# Patient Record
Sex: Male | Born: 1970 | Race: White | Hispanic: No | Marital: Married | Smoking: Never smoker
Health system: Southern US, Community
[De-identification: ages and names within clinical notes are randomized; demographics above are authoritative.]

## PROBLEM LIST (undated history)

## (undated) DIAGNOSIS — K429 Umbilical hernia without obstruction or gangrene: Secondary | ICD-10-CM

## (undated) DIAGNOSIS — D6851 Activated protein C resistance: Secondary | ICD-10-CM

## (undated) HISTORY — DX: Umbilical hernia without obstruction or gangrene: K42.9

---

## 2011-07-10 ENCOUNTER — Other Ambulatory Visit (HOSPITAL_BASED_OUTPATIENT_CLINIC_OR_DEPARTMENT_OTHER): Payer: Self-pay | Admitting: Family Medicine

## 2011-07-12 ENCOUNTER — Ambulatory Visit (HOSPITAL_BASED_OUTPATIENT_CLINIC_OR_DEPARTMENT_OTHER)
Admission: RE | Admit: 2011-07-12 | Discharge: 2011-07-12 | Disposition: A | Discharge status: Home / Self Care | Payer: 59 | Source: Ambulatory Visit | Attending: Family Medicine | Admitting: Family Medicine

## 2011-07-12 DIAGNOSIS — K7689 Other specified diseases of liver: Secondary | ICD-10-CM | POA: Insufficient documentation

## 2011-07-12 DIAGNOSIS — R109 Unspecified abdominal pain: Secondary | ICD-10-CM | POA: Insufficient documentation

## 2011-07-12 DIAGNOSIS — R1031 Right lower quadrant pain: Secondary | ICD-10-CM | POA: Insufficient documentation

## 2011-07-12 MED ORDER — IOHEXOL 300 MG/ML  SOLN: 100.0000 mL | Freq: Once | INTRAMUSCULAR | Status: AC | PRN

## 2012-01-08 ENCOUNTER — Other Ambulatory Visit (HOSPITAL_BASED_OUTPATIENT_CLINIC_OR_DEPARTMENT_OTHER): Payer: Self-pay | Admitting: Family Medicine

## 2012-01-08 DIAGNOSIS — D1800 Hemangioma unspecified site: Secondary | ICD-10-CM

## 2012-01-12 ENCOUNTER — Ambulatory Visit (HOSPITAL_BASED_OUTPATIENT_CLINIC_OR_DEPARTMENT_OTHER)
Admission: RE | Admit: 2012-01-12 | Discharge: 2012-01-12 | Disposition: A | Discharge status: Home / Self Care | Payer: 59 | Source: Ambulatory Visit | Attending: Family Medicine | Admitting: Family Medicine

## 2012-01-12 DIAGNOSIS — D1800 Hemangioma unspecified site: Secondary | ICD-10-CM

## 2012-01-12 DIAGNOSIS — D1803 Hemangioma of intra-abdominal structures: Secondary | ICD-10-CM | POA: Insufficient documentation

## 2012-01-12 MED ORDER — GADOBENATE DIMEGLUMINE 529 MG/ML IV SOLN
20.0000 mL | Freq: Once | INTRAVENOUS | Status: AC | PRN
  Administered 2012-01-12: 20 mL via INTRAVENOUS

## 2013-03-01 ENCOUNTER — Emergency Department (HOSPITAL_BASED_OUTPATIENT_CLINIC_OR_DEPARTMENT_OTHER): Payer: 59

## 2013-03-01 ENCOUNTER — Encounter (HOSPITAL_BASED_OUTPATIENT_CLINIC_OR_DEPARTMENT_OTHER): Payer: Self-pay | Admitting: *Deleted

## 2013-03-01 ENCOUNTER — Emergency Department (HOSPITAL_BASED_OUTPATIENT_CLINIC_OR_DEPARTMENT_OTHER)
Admission: EM | Admit: 2013-03-01 | Discharge: 2013-03-01 | Disposition: A | Discharge status: Home / Self Care | Payer: 59 | Attending: Emergency Medicine | Admitting: Emergency Medicine

## 2013-03-01 DIAGNOSIS — S300XXA Contusion of lower back and pelvis, initial encounter: Secondary | ICD-10-CM

## 2013-03-01 DIAGNOSIS — S20229A Contusion of unspecified back wall of thorax, initial encounter: Secondary | ICD-10-CM | POA: Insufficient documentation

## 2013-03-01 DIAGNOSIS — R42 Dizziness and giddiness: Secondary | ICD-10-CM | POA: Insufficient documentation

## 2013-03-01 DIAGNOSIS — R110 Nausea: Secondary | ICD-10-CM | POA: Insufficient documentation

## 2013-03-01 DIAGNOSIS — Y9239 Other specified sports and athletic area as the place of occurrence of the external cause: Secondary | ICD-10-CM | POA: Insufficient documentation

## 2013-03-01 DIAGNOSIS — Y9389 Activity, other specified: Secondary | ICD-10-CM | POA: Insufficient documentation

## 2013-03-01 DIAGNOSIS — R509 Fever, unspecified: Secondary | ICD-10-CM | POA: Insufficient documentation

## 2013-03-01 DIAGNOSIS — Z862 Personal history of diseases of the blood and blood-forming organs and certain disorders involving the immune mechanism: Secondary | ICD-10-CM | POA: Insufficient documentation

## 2013-03-01 HISTORY — DX: Activated protein C resistance: D68.51

## 2013-03-01 LAB — CBC
HCT: 41.5 % (ref 39.0–52.0)
MCH: 32.8 pg (ref 26.0–34.0)
MCHC: 35.4 g/dL (ref 30.0–36.0)
MCV: 92.6 fL (ref 78.0–100.0)
RDW: 12.6 % (ref 11.5–15.5)

## 2013-03-01 LAB — BASIC METABOLIC PANEL
BUN: 19 mg/dL (ref 6–23)
Creatinine, Ser: 1.1 mg/dL (ref 0.50–1.35)
GFR calc Af Amer: >90 mL/min (ref >90)
GFR calc non Af Amer: 81 mL/min — ABNORMAL LOW (ref >90)

## 2013-03-01 MED ORDER — SODIUM CHLORIDE 0.9 % IV SOLN
INTRAVENOUS | Status: DC
  Administered 2013-03-01: 20:00:00 via INTRAVENOUS

## 2013-03-01 MED ORDER — IOHEXOL 300 MG/ML  SOLN
100.0000 mL | Freq: Once | INTRAMUSCULAR | Status: AC | PRN
  Administered 2013-03-01: 100 mL via INTRAVENOUS

## 2013-03-01 NOTE — ED Notes (Signed)
Pt reports he was thrown from horse this afternoon and landed on sand- was wearing helmet- landed on back then hit head- states similar 1 week ago- reports he went to dinner this evening and while driving home had an episode of nausea, diaphoresis and "lost my vision". Sx have resolved at present. Denies pain.

## 2013-03-01 NOTE — ED Notes (Signed)
Pt was thrown from a horse earlier today. Having some low back pain, but was doing ok until he ate supper. Began feeling light-headed and lost vision temporarily. No neuro s/s at present. PERRL.

## 2013-03-01 NOTE — ED Provider Notes (Signed)
History  This chart was scribed for Celene Kras, MD by Greggory Stallion, ED Scribe. This patient was seen in room MH05/MH05 and the patient's care was started at 6:47 PM.  CSN: [id number]  Arrival date & time 03/01/13  1836    Chief Complaint  Patient presents with  . Fall    The history is provided by the patient. No language interpreter was used.    HPI Comments: Shane [name] is a 42 y.o. male who presents to the Emergency Department complaining of sudden, constant lower back pain that started earlier today when he was thrown from a horse. Pt states he hit his head but he had a helmet on. He states he had a fever and nausea as associated symptoms. Pt states he was doing okay until he ate dinner and began to feel light-headed and lost vision temporarily. Pt denies neck pain, sore throat, CP, cough, SOB, abdominal pain, diarrhea, urinary symptoms, HA, weakness, numbness and rash as associated symptoms. Pt states he took Motrin for pain with some relief and states he does not need any more currently.  Past Medical History  Diagnosis Date  . Factor 5 Leiden mutation, heterozygous     History reviewed. No pertinent past surgical history.  History reviewed. No pertinent family history.  History  Substance Use Topics  . Smoking status: Never Smoker   . Smokeless tobacco: Not on file  . Alcohol Use: Yes      Review of Systems  A complete 10 system review of systems was obtained and all systems are negative except as noted in the HPI and PMH.   Allergies  Zithromax  Home Medications   Current Outpatient Rx  Name  Route  Sig  Dispense  Refill  . cholestyramine (QUESTRAN) 4 GM/DOSE powder   Oral   Take 4 g by mouth 3 (three) times daily with meals.           BP 102/59  Pulse 78  Temp(Src) 98.2 F (36.8 C) (Oral)  Resp 20  Ht 6\' 1"  (1.854 m)  Wt 195 lb (88.451 kg)  BMI 25.73 kg/m2  SpO2 95%  Physical Exam  Nursing note and vitals  reviewed. Constitutional: He appears well-developed and well-nourished. No distress.  HENT:  Head: Normocephalic and atraumatic. Head is without raccoon's eyes and without Battle's sign.  Right Ear: External ear normal.  Left Ear: External ear normal.  Eyes: Lids are normal. Right eye exhibits no discharge. Right conjunctiva has no hemorrhage. Left conjunctiva has no hemorrhage.  Neck: No spinous process tenderness present. No tracheal deviation and no edema present.  Cardiovascular: Normal rate, regular rhythm and normal heart sounds.   Pulmonary/Chest: Effort normal and breath sounds normal. No stridor. No respiratory distress. He exhibits no tenderness, no crepitus and no deformity.  Abdominal: Soft. Normal appearance and bowel sounds are normal. He exhibits no distension and no mass. There is no tenderness.  Negative for seat belt sign  Musculoskeletal:       Cervical back: He exhibits no tenderness, no swelling and no deformity.       Thoracic back: He exhibits no tenderness, no swelling and no deformity.       Lumbar back: He exhibits tenderness, swelling and pain.  Pelvis stable, no ttp, contusion noted on the left hip area as well as the lower lumbosacral  Neurological: He is alert. He has normal strength. No sensory deficit. He exhibits normal muscle tone. GCS eye subscore is 4. GCS verbal  subscore is 5. GCS motor subscore is 6.  Able to move all extremities, sensation intact throughout  Skin: He is not diaphoretic.  Psychiatric: He has a normal mood and affect. His speech is normal and behavior is normal.    ED Course  Procedures (including critical care time) EKG Normal sinus rhythm rate 91 Normal axis, normal intervals Normal ST-T wave No prior EKG for comparison DIAGNOSTIC STUDIES: Oxygen Saturation is 95% on RA, adequate by my interpretation.    COORDINATION OF CARE: 7:00 PM-Discussed treatment plan with pt at bedside and pt agreed to plan.   Labs Reviewed  CBC -  Abnormal; Notable for the following:    WBC 16.7 (*)    All other components within normal limits  BASIC METABOLIC PANEL - Abnormal; Notable for the following:    GFR calc non Af Amer 81 (*)    All other components within normal limits   Ct Head Wo Contrast  03/01/2013   *RADIOLOGY REPORT*  Clinical Data: Thrown from a horse, striking the head.  The patient did have on a helmet.  Current history of Factor V Leiden mutation.  CT HEAD WITHOUT CONTRAST  Technique:  Contiguous axial images were obtained from the base of the skull through the vertex without contrast.  Comparison: None.  Findings: Ventricular system normal in size and appearance for age. No mass lesion.  No midline shift.  No acute hemorrhage or hematoma.  No extra-axial fluid collections.  No evidence of acute infarction.  No focal brain parenchymal abnormalities.  No skull fractures or other focal osseous abnormalities involving the skull.  Visualized paranasal sinuses, mastoid air cells, and middle ear cavities well-aerated.  IMPRESSION: Normal examination.   Original Report Authenticated By: Hulan Saas, M.D.   Ct Abdomen Pelvis W Contrast  03/01/2013   *RADIOLOGY REPORT*  Clinical Data: Thrown from a horse earlier today.  Generalized back pain.  Current history of Factor V Leiden mutation.  CT ABDOMEN AND PELVIS WITH CONTRAST  Technique:  Multidetector CT imaging of the abdomen and pelvis was performed following the standard protocol during bolus administration of intravenous contrast.  Contrast: OMNIPAQUE IOHEXOL 300 MG/ML IV.  Comparison: CT abdomen and pelvis 07/12/2011.  MRI abdomen 01/12/2012.  Findings: Edema/ecchymosis involving the subcutaneous tissues of the right flank and right side of pelvis, extending to the posterior midline.  Hematoma in the posterior subcutaneous tissues underlying the upper pelvis measuring approximately 12.4 x 3.7 x 12.2 cm (axial image 62 andsagittal image 57).  No evidence of intraperitoneal  hemorrhage or retroperitoneal hematoma in the abdomen or pelvis.  No acute traumatic injury to the abdominal or pelvic viscera. Approximate 3.5 cm right hepatic hemangioma near the dome as noted on prior examinations.  No significant focal hepatic parenchymal abnormality.  Normal appearing spleen, pancreas, adrenal glands, and kidneys.  Gallbladder contracted but otherwise unremarkable (large amount of food in the stomach accounts for this).  Normal vascular structures.  No significant lymphadenopathy.  Normal-appearing stomach filled with food.  Normal-appearing small bowel.  Opaque ingested material within the ascending colon; entire colon normal in appearance.  Normal appendix in the right low pelvis.  No ascites.  Urinary bladder unremarkable.  Prostate gland and seminal vesicles normal for age.  Bone window images demonstrate no fractures involving the lower thoracic or lumbar spine, lower ribs, or bony pelvis.  Disc space narrowing is present at L4-5.  Visualized lung bases clear.  Heart size normal.  IMPRESSION:  1.  No evidence of  acute traumatic injury to the abdominal or pelvic viscera. 2.  Edema/ecchymosis in the subcutaneous tissues of the right flank extending to the posterior midline in the upper pelvis.  There is an associated moderately large subcutaneous hematoma. 3.  No evidence of intraperitoneal hemorrhage or retroperitoneal hematoma in the abdomen or pelvis. 4.  No fractures identified in the visualized skeleton.  5.  Approximate 3.5 cm right lobe hepatic hemangioma near the dome as noted previously.   Original Report Authenticated By: Hulan Saas, M.D.     1. Lumbar contusion, initial encounter   2. Fall from horse, initial encounter       MDM  The patient does not have any evidence of serious injury associated with his fall. I suspect he may have had a vasovagal episode earlier.  He has been asymptomatic and stable in the ED.  At this time there does not appear to be any  evidence of an acute emergency medical condition and the patient appears stable for discharge with appropriate outpatient follow up.    I personally performed the services described in this documentation, which was scribed in my presence. The recorded information has been reviewed and is accurate.   Celene Kras, MD 03/01/13 [phone]

## 2013-03-01 NOTE — ED Notes (Signed)
Patient transported to CT 

## 2013-03-01 NOTE — ED Notes (Signed)
MD at bedside. Dr. Lynelle Doctor in to talk with pt.

## 2015-04-30 ENCOUNTER — Emergency Department (HOSPITAL_BASED_OUTPATIENT_CLINIC_OR_DEPARTMENT_OTHER)
Admission: EM | Admit: 2015-04-30 | Discharge: 2015-04-30 | Disposition: A | Discharge status: Home / Self Care | Payer: 59 | Attending: Emergency Medicine | Admitting: Emergency Medicine

## 2015-04-30 ENCOUNTER — Encounter (HOSPITAL_BASED_OUTPATIENT_CLINIC_OR_DEPARTMENT_OTHER): Payer: Self-pay | Admitting: *Deleted

## 2015-04-30 DIAGNOSIS — Z79899 Other long term (current) drug therapy: Secondary | ICD-10-CM | POA: Diagnosis not present

## 2015-04-30 DIAGNOSIS — Y9289 Other specified places as the place of occurrence of the external cause: Secondary | ICD-10-CM | POA: Insufficient documentation

## 2015-04-30 DIAGNOSIS — S0101XA Laceration without foreign body of scalp, initial encounter: Secondary | ICD-10-CM | POA: Diagnosis not present

## 2015-04-30 DIAGNOSIS — S0990XA Unspecified injury of head, initial encounter: Secondary | ICD-10-CM | POA: Diagnosis present

## 2015-04-30 DIAGNOSIS — Y998 Other external cause status: Secondary | ICD-10-CM | POA: Diagnosis not present

## 2015-04-30 DIAGNOSIS — W01198A Fall on same level from slipping, tripping and stumbling with subsequent striking against other object, initial encounter: Secondary | ICD-10-CM | POA: Insufficient documentation

## 2015-04-30 DIAGNOSIS — Z862 Personal history of diseases of the blood and blood-forming organs and certain disorders involving the immune mechanism: Secondary | ICD-10-CM | POA: Insufficient documentation

## 2015-04-30 DIAGNOSIS — Z23 Encounter for immunization: Secondary | ICD-10-CM | POA: Diagnosis not present

## 2015-04-30 DIAGNOSIS — Y9389 Activity, other specified: Secondary | ICD-10-CM | POA: Diagnosis not present

## 2015-04-30 MED ORDER — LIDOCAINE-EPINEPHRINE-TETRACAINE (LET) SOLUTION
3.0000 mL | Freq: Once | NASAL | Status: AC
  Administered 2015-04-30: 3 mL via TOPICAL
  Filled 2015-04-30: qty 3

## 2015-04-30 MED ORDER — TETANUS-DIPHTH-ACELL PERTUSSIS 5-2.5-18.5 LF-MCG/0.5 IM SUSP
0.5000 mL | Freq: Once | INTRAMUSCULAR | Status: AC
  Administered 2015-04-30: 0.5 mL via INTRAMUSCULAR
  Filled 2015-04-30: qty 0.5

## 2015-04-30 MED ORDER — LIDOCAINE-EPINEPHRINE (PF) 2 %-1:200000 IJ SOLN
10.0000 mL | Freq: Once | INTRAMUSCULAR | Status: AC
  Administered 2015-04-30: 10 mL
  Filled 2015-04-30: qty 10

## 2015-04-30 NOTE — ED Notes (Signed)
approx 29mm laceration noted at forehead, hairline, no active bleeding noted at this time

## 2015-04-30 NOTE — ED Provider Notes (Signed)
CSN: [id number]     Arrival date & time 04/30/15  1134 History   First MD Initiated Contact with Patient 04/30/15 1211     Chief Complaint  Patient presents with  . Head Injury     (Consider location/radiation/quality/duration/timing/severity/associated sxs/prior Treatment) HPI   Blood pressure 145/90, pulse 80, temperature 97.9 F (36.6 C), temperature source Oral, resp. rate 18, height 6\' 1"  (1.854 m), weight 200 lb (90.719 kg), SpO2 99 %.  Shane [name] is a 44 y.o. male complaining of laceration to frontal scalp after patient fell off a however board approximately an hour and a half ago. Patient's head hit the side of the refrigerator, there was no loss of consciousness, anticoagulation, cervicalgia, change in vision, nausea, vomiting, numbness, weakness, chest pain, shortness of breath, abdominal pain, difficulty moving major joints. He rates his pain at 2 out of 10. His tetanus shot is unknown.  Past Medical History  Diagnosis Date  . Factor 5 Leiden mutation, heterozygous    History reviewed. No pertinent past surgical history. History reviewed. No pertinent family history. History  Substance Use Topics  . Smoking status: Never Smoker   . Smokeless tobacco: Not on file  . Alcohol Use: Yes    Review of Systems  10 systems reviewed and found to be negative, except as noted in the HPI.   Allergies  Zithromax  Home Medications   Prior to Admission medications   Medication Sig Start Date End Date Taking? Authorizing Provider  cholestyramine (QUESTRAN) 4 GM/DOSE powder Take 4 g by mouth 3 (three) times daily with meals.    Historical Provider, MD   BP 145/90 mmHg  Pulse 80  Temp(Src) 97.9 F (36.6 C) (Oral)  Resp 18  Ht 6\' 1"  (1.854 m)  Wt 200 lb (90.719 kg)  BMI 26.39 kg/m2  SpO2 99% Physical Exam  Constitutional: He is oriented to person, place, and time. He appears well-developed and well-nourished.  HENT:  Head: Normocephalic.    Mouth/Throat:  Oropharynx is clear and moist.  2 cm full  thickness laceration as diagrammed  No hemotympanum, battle signs or raccoon's eyes  No crepitance or tenderness to palpation along the orbital rim.  EOMI intact with no pain or diplopia  No abnormal otorrhea or rhinorrhea. Nasal septum midline.  No intraoral trauma.  Eyes: Conjunctivae and EOM are normal. Pupils are equal, round, and reactive to light.  Neck: Normal range of motion. Neck supple.  No midline C-spine  tenderness to palpation or step-offs appreciated. Patient has full range of motion without pain.  Grip/Biceps/Tricep strength 5/5 bilaterally, sensation to UE intact bilaterally.    Cardiovascular: Normal rate, regular rhythm and intact distal pulses.   Pulmonary/Chest: Effort normal and breath sounds normal. No respiratory distress. He has no wheezes. He has no rales. He exhibits no tenderness.  No seatbelt sign, TTP or crepitance  Abdominal: Soft. Bowel sounds are normal. He exhibits no distension and no mass. There is no tenderness. There is no rebound and no guarding.  No Seatbelt Sign  Musculoskeletal: Normal range of motion. He exhibits no edema or tenderness.  Pelvis stable. No deformity or TTP of major joints.   Good ROM  Neurological: He is alert and oriented to person, place, and time.  Strength 5/5 x4 extremities   Distal sensation intact  Skin: Skin is warm.  Psychiatric: He has a normal mood and affect.  Nursing note and vitals reviewed.   ED Course  LACERATION REPAIR Date/Time: 04/30/2015 1:30 PM Performed  by: [place], [place] by: Monico Blitz Consent: Verbal consent obtained. Patient identity confirmed: verbally with patient Body area: head/neck Laceration length: 2 cm Tendon involvement: none Anesthesia: local infiltration Local anesthetic: lidocaine 2% with epinephrine and LET (lido,epi,tetracaine) Anesthetic total: 5 ml Preparation: Patient was prepped and draped in the usual  sterile fashion. Irrigation solution: saline Irrigation method: syringe Amount of cleaning: standard Debridement: none Degree of undermining: none Wound skin closure material used: 5-0 Vicryl Rapide. Number of sutures: 6 Technique: simple Approximation: close Approximation difficulty: simple Dressing: antibiotic ointment Patient tolerance: Patient tolerated the procedure well with no immediate complications   (including critical care time) Labs Review Labs Reviewed - No data to display  Imaging Review No results found.   EKG Interpretation None      MDM   Final diagnoses:  None    Filed Vitals:   04/30/15 1138  BP: 145/90  Pulse: 80  Temp: 97.9 F (36.6 C)  TempSrc: Oral  Resp: 18  Height: 6\' 1"  (1.854 m)  Weight: 200 lb (90.719 kg)  SpO2: 99%    Medications  lidocaine-EPINEPHrine-tetracaine (LET) solution (not administered)  Tdap (BOOSTRIX) injection 0.5 mL (not administered)  lidocaine-EPINEPHrine (XYLOCAINE W/EPI) 2 %-1:200000 (PF) injection 10 mL (not administered)    Shane [name] is a pleasant 44 y.o. male presenting with partial thickness scalp laceration after patient fell while playing with a however board. No signs of significant head trauma, no neuro imaging is indicated. Tetanus will be updated and will be closed  Evaluation does not show pathology that would require ongoing emergent intervention or inpatient treatment. Pt is hemodynamically stable and mentating appropriately. Discussed findings and plan with patient/guardian, who agrees with care plan. All questions answered. Return precautions discussed and outpatient follow up given.       Monico Blitz, PA-C 04/30/15 [place], MD 04/30/15 1558

## 2015-04-30 NOTE — Discharge Instructions (Signed)
Keep wound dry and do not remove dressing for 24 hours if possible. After that, wash gently morning and night (every 12 hours) with soap and water. Use a topical antibiotic ointment and cover with a bandaid or gauze.    Do NOT use rubbing alcohol or hydrogen peroxide, do not soak the area   After 10-14 days you can try to gently remove the outer part of the suture material with a tweezers. Your suture material is dissolving but the outside will not resolve.   Every attempt was made to remove foreign body (contaminants) from the wound.  However, there is always a chance that some may remain in the wound. This can  increase your risk of infection.   If you see signs of infection (warmth, redness, tenderness, pus, sharp increase in pain, fever, red streaking in the skin) immediately return to the emergency department.   After the wound heals fully, apply sunscreen for 6-12 months to minimize scarring.

## 2015-04-30 NOTE — ED Notes (Signed)
Pt states fell off a hover board, forehead hit edge of refrigerator

## 2015-04-30 NOTE — ED Notes (Signed)
Pt c/o head injury with lac fall off "hoverboard" x 45 mins ago  Denies LOC

## 2018-05-29 ENCOUNTER — Ambulatory Visit: Payer: 59 | Admitting: Family Medicine

## 2018-05-29 ENCOUNTER — Encounter: Payer: Self-pay | Admitting: Family Medicine

## 2018-05-29 VITALS — BP 124/78 | HR 86 | Temp 98.1°F | Ht 73.0 in | Wt 202.0 lb

## 2018-05-29 DIAGNOSIS — M67911 Unspecified disorder of synovium and tendon, right shoulder: Secondary | ICD-10-CM

## 2018-05-29 DIAGNOSIS — R7989 Other specified abnormal findings of blood chemistry: Secondary | ICD-10-CM

## 2018-05-29 DIAGNOSIS — K529 Noninfective gastroenteritis and colitis, unspecified: Secondary | ICD-10-CM | POA: Insufficient documentation

## 2018-05-29 DIAGNOSIS — K429 Umbilical hernia without obstruction or gangrene: Secondary | ICD-10-CM | POA: Diagnosis not present

## 2018-05-29 DIAGNOSIS — J309 Allergic rhinitis, unspecified: Secondary | ICD-10-CM | POA: Insufficient documentation

## 2018-05-29 DIAGNOSIS — N529 Male erectile dysfunction, unspecified: Secondary | ICD-10-CM | POA: Diagnosis not present

## 2018-05-29 MED ORDER — DICLOFENAC SODIUM 75 MG PO TBEC: 75.0000 mg | DELAYED_RELEASE_TABLET | Freq: Two times a day (BID) | ORAL | 0 refills | Status: DC

## 2018-05-29 MED ORDER — SILDENAFIL CITRATE 20 MG PO TABS: 20.0000 mg | ORAL_TABLET | Freq: Every day | ORAL | 3 refills | Status: DC | PRN

## 2018-05-29 NOTE — Assessment & Plan Note (Signed)
May possibly have small tear as well.  We will proceed with conservative management given that symptoms have been persistent for the past several months years.  Start diclofenac 75 mg twice daily for the next 1 to 2 weeks.  Discussed home exercise program as well.  Recommended ice as needed.  No improvement, would consider referral to physical therapy and/or sports medicine.

## 2018-05-29 NOTE — Patient Instructions (Signed)
It was very nice to see you today!  Please start the sildenafil.  Please start the diclofenac and work on the exercises.  Let me know if your symptoms are worsening or not improving over the next few weeks.   Take care, Dr Jerline Pain

## 2018-05-29 NOTE — Assessment & Plan Note (Signed)
Stable on cholestyramine. 

## 2018-05-29 NOTE — Progress Notes (Signed)
Subjective:  Shane [name] is a 47 y.o. male who presents today with a chief complaint of shoulder pain and to establish care.   HPI:  Shoulder Pain, chronic problem, new to provider Several year history.  Located in the right shoulder.  Worse with movement above his head.  Symptoms have worsened recently.  He used to play baseball and thinks that this could have contributed.  No obvious injuries or precipitating events.  Tried heat and ice without significant improvement.  No other treatments tried.  No obvious alleviating or aggravating factors.  Umbilical hernia, chronic problem, new to provider Several year history.  Has been evaluated by  PCP.  He has not having any significant symptoms.  Allergic Rhinitis, chronic problem, new to provider Several year history.  Takes Xyzal as needed.  Low testosterone, chronic problem, new to provider Severe history.  Currently on testosterone injections 300 mg every 2 weeks.  Symptoms have with his energy levels and low libido, however sometimes still has difficulty with erections.  No premature ejaculation.  No reported dysuria or penile discharge.  ROS: Per HPI, otherwise a complete review of systems was negative.   PMH:  The following were reviewed and entered/updated in epic: Past Medical History:  Diagnosis Date  . Factor 5 Leiden mutation, heterozygous ([place])   . Umbilical hernia    Patient Active Problem List   Diagnosis Date Noted  . Tendinopathy of rotator cuff, right 05/29/2018  . Erectile dysfunction 05/29/2018  . Allergic rhinitis 05/29/2018  . Low testosterone 05/29/2018  . Chronic diarrhea 05/29/2018  . Umbilical hernia    History reviewed. No pertinent surgical history.  Family History  Problem Relation Age of Onset  . Cancer Neg Hx     Medications- reviewed and updated Current Outpatient Medications  Medication Sig Dispense Refill  . cholestyramine (QUESTRAN) 4 GM/DOSE powder Take 4 g by mouth Nightly.      . levocetirizine (XYZAL) 5 MG tablet Take 5 mg by mouth every evening.    . Multiple Vitamin (MULTIVITAMIN) tablet Take 1 tablet by mouth daily.    Marland Kitchen testosterone cypionate (DEPOTESTOTERONE CYPIONATE) 100 MG/ML injection Inject 300 mg into the muscle every 14 (fourteen) days. For IM use only    . diclofenac (VOLTAREN) 75 MG EC tablet Take 1 tablet (75 mg total) by mouth 2 (two) times daily. 30 tablet 0  . sildenafil (REVATIO) 20 MG tablet Take 1-5 tablets (20-100 mg total) by mouth daily as needed (erectile dysfunction). 90 tablet 3   No current facility-administered medications for this visit.     Allergies-reviewed and updated Allergies  Allergen Reactions  . Zithromax [Azithromycin] Hives    Social History   Socioeconomic History  . Marital status: Married    Spouse name: Not on file  . Number of children: Not on file  . Years of education: Not on file  . Highest education level: Not on file  Occupational History  . Not on file  Social Needs  . Financial resource strain: Not on file  . Food insecurity:    Worry: Not on file    Inability: Not on file  . Transportation needs:    Medical: Not on file    Non-medical: Not on file  Tobacco Use  . Smoking status: Never Smoker  . Smokeless tobacco: Never Used  Substance and Sexual Activity  . Alcohol use: Yes  . Drug use: No  . Sexual activity: Not on file  Lifestyle  . Physical activity:  Days per week: Not on file    Minutes per session: Not on file  . Stress: Not on file  Relationships  . Social connections:    Talks on phone: Not on file    Gets together: Not on file    Attends religious service: Not on file    Active member of club or organization: Not on file    Attends meetings of clubs or organizations: Not on file    Relationship status: Not on file  Other Topics Concern  . Not on file  Social History Narrative  . Not on file    Objective:  Physical Exam: BP 124/78 (BP Location: Left Arm,  Patient Position: Sitting, Cuff Size: Normal)   Pulse 86   Temp 98.1 F (36.7 C) (Oral)   Ht 6\' 1"  (1.854 m)   Wt 202 lb (91.6 kg)   SpO2 98%   BMI 26.65 kg/m   Gen: NAD, resting comfortably CV: RRR with no murmurs appreciated Pulm: NWOB, CTAB with no crackles, wheezes, or rhonchi GI: Normal bowel sounds present. Soft, Nontender, Nondistended.  Easily reducible umbilical hernia noted. MSK: No edema, cyanosis, or clubbing noted -Right shoulder: No deformities.  Tender to palpation along anterior aspect of shoulder.  Strength 5 out of 5 with resisted supra spinatus testing.  Strength 5 out of 5 with internal and external rotation, however pain is elicited with external rotation.  Neer test negative.  Hawkins test positive.  Crossover test negative. Skin: Warm, dry Neuro: Grossly normal, moves all extremities Psych: Normal affect and thought content  Assessment/Plan:  Umbilical hernia No red flag signs or symptoms.  Continue with watchful waiting.  Tendinopathy of rotator cuff, right May possibly have small tear as well.  We will proceed with conservative management given that symptoms have been persistent for the past several months years.  Start diclofenac 75 mg twice daily for the next 1 to 2 weeks.  Discussed home exercise program as well.  Recommended ice as needed.  No improvement, would consider referral to physical therapy and/or sports medicine.  Low testosterone Obtain records from previous PCP.  We will continue testosterone 300 mg every 2 weeks.  Will need repeat testosterone level with next blood draw.   Erectile dysfunction Start sildenafil 20 to 100 mg as needed.  Allergic rhinitis Stable.  Continue Xyzal.  Chronic diarrhea Stable on cholestyramine.  Preventative Healthcare Patient was instructed to return soon for CPE. Health Maintenance Due  Topic Date Due  . HIV Screening  09/19/1984  . INFLUENZA VACCINE  05/08/2018   Algis Greenhouse. Jerline Pain, MD 05/29/2018 4:41  PM

## 2018-05-29 NOTE — Assessment & Plan Note (Addendum)
Obtain records from previous PCP.  We will continue testosterone 300 mg every 2 weeks.  Will need repeat testosterone level with next blood draw.

## 2018-05-29 NOTE — Assessment & Plan Note (Signed)
Start sildenafil 20 to 100 mg as needed.

## 2018-05-29 NOTE — Assessment & Plan Note (Signed)
No red flag signs or symptoms.  Continue with watchful waiting.

## 2018-05-29 NOTE — Assessment & Plan Note (Signed)
Stable.  Continue Xyzal.

## 2018-07-02 ENCOUNTER — Encounter: Payer: Self-pay | Admitting: Family Medicine

## 2018-07-02 ENCOUNTER — Ambulatory Visit: Payer: 59 | Admitting: Family Medicine

## 2018-07-02 VITALS — BP 122/74 | HR 77 | Temp 97.6°F | Ht 73.0 in | Wt 201.8 lb

## 2018-07-02 DIAGNOSIS — R1904 Left lower quadrant abdominal swelling, mass and lump: Secondary | ICD-10-CM

## 2018-07-02 DIAGNOSIS — M67911 Unspecified disorder of synovium and tendon, right shoulder: Secondary | ICD-10-CM

## 2018-07-02 DIAGNOSIS — R1900 Intra-abdominal and pelvic swelling, mass and lump, unspecified site: Secondary | ICD-10-CM | POA: Insufficient documentation

## 2018-07-02 DIAGNOSIS — R7989 Other specified abnormal findings of blood chemistry: Secondary | ICD-10-CM

## 2018-07-02 MED ORDER — DICLOFENAC SODIUM 75 MG PO TBEC: 75.0000 mg | DELAYED_RELEASE_TABLET | Freq: Two times a day (BID) | ORAL | 0 refills | Status: DC

## 2018-07-02 MED ORDER — TESTOSTERONE CYPIONATE 100 MG/ML IM SOLN: 300.0000 mg | INTRAMUSCULAR | 2 refills | Status: DC

## 2018-07-02 NOTE — Assessment & Plan Note (Signed)
Testosterone refilled.  We are awaiting records from his previous PCP.  Database was reviewed without red flags.  He will follow-up in 6 months for his physical.  Recheck testosterone level at that time.

## 2018-07-02 NOTE — Patient Instructions (Signed)
It was very nice to see you today!  Please take the diclofenac as needed for your shoulder.  We will refer you to orthopedics.  I will refill your testosterone.  I think your abdominal lump is due to benign calcifications. Please keep an eye on the area and let me know if it changes in any way.  Come back to see me for your physical in about 6 months. Please make sure you come in midcycle and early in the morning so that we can recheck your testosterone.  Please come back sooner as needed.   Take care, Dr Jerline Pain

## 2018-07-02 NOTE — Assessment & Plan Note (Signed)
Patient has failed conservative management.  Will place referral to orthopedics for further evaluation.  Will refill his diclofenac today.

## 2018-07-02 NOTE — Progress Notes (Signed)
   Subjective:  Shane [name] is a 47 y.o. male who presents today with a chief complaint of shoulder pain.   HPI:  Shoulder Pain, chronic problem, stable Patient last seen about a month ago for this.  Diagnosed with rotator cuff tendinopathy.  Was started on home exercise program and a course of diclofenac.  Symptoms improved modestly on diclofenac, however have returned.  Still has significant difficulty lifting arm above his shoulder.  He has some associated right-sided neck pain for the last several months as well.  Also with some right forearm pain.  No weakness or numbness.  Abdominal Lump Started a couple of months ago in his left lower abdomen.  No pain to the area.  No nausea or vomiting.  No bowel changes.  Patient presently area and felt like it broke up.  Since then he has noticed a few small lumps in the area.  They have been stable over the last couple of months.  Hypotestosteronism/erectile dysfunction Currently on testosterone 300 mg every 2 weeks and sildenafil as needed.  Symptoms seem to be well controlled.  Last had blood work done about 6 months ago which was reportedly normal.  ROS: Per HPI  PMH: He reports that he has never smoked. He has never used smokeless tobacco. He reports that he drinks alcohol. He reports that he does not use drugs.  Objective:  Physical Exam: BP 122/74 (BP Location: Left Arm, Patient Position: Sitting, Cuff Size: Normal)   Pulse 77   Temp 97.6 F (36.4 C) (Oral)   Ht 6\' 1"  (1.854 m)   Wt 201 lb 12.8 oz (91.5 kg)   SpO2 96%   BMI 26.62 kg/m   Gen: NAD, resting comfortably CV: RRR with no murmurs appreciated Pulm: NWOB, CTAB with no crackles, wheezes, or rhonchi GI: Diffuse scattered subcutaneous nodules in left lower abdomen.  Nontender to palpation.  Freely mobile. MSK:  -Neck: No deformities.  Full range of motion.  Tender to palpation along right paraspinal muscles.  -Right shoulder: No deformities.  Weakness with  abduction above level shoulder. -Right elbow: No lateral epicondylar tenderness.  Mild tenderness to palpation along wrist extensor muscle group.   Assessment/Plan:  Abdominal lump Likely benign calcifications.  No red flag signs or symptoms.  We will proceed with watchful waiting.  Discussed reasons return to care including increase in number or size of labs, development of pain, or any other symptoms.  Tendinopathy of rotator cuff, right Patient has failed conservative management.  Will place referral to orthopedics for further evaluation.  Will refill his diclofenac today.  Low testosterone Testosterone refilled.  We are awaiting records from his previous PCP.  Database was reviewed without red flags.  He will follow-up in 6 months for his physical.  Recheck testosterone level at that time.  Algis Greenhouse. Jerline Pain, MD 07/02/2018 12:05 PM

## 2018-07-02 NOTE — Assessment & Plan Note (Signed)
Likely benign calcifications.  No red flag signs or symptoms.  We will proceed with watchful waiting.  Discussed reasons return to care including increase in number or size of labs, development of pain, or any other symptoms.

## 2018-07-15 ENCOUNTER — Ambulatory Visit (INDEPENDENT_AMBULATORY_CARE_PROVIDER_SITE_OTHER): Payer: Self-pay

## 2018-07-15 ENCOUNTER — Encounter (INDEPENDENT_AMBULATORY_CARE_PROVIDER_SITE_OTHER): Payer: Self-pay | Admitting: Orthopaedic Surgery

## 2018-07-15 ENCOUNTER — Ambulatory Visit (INDEPENDENT_AMBULATORY_CARE_PROVIDER_SITE_OTHER): Payer: 59 | Admitting: Orthopaedic Surgery

## 2018-07-15 VITALS — Ht 72.0 in | Wt 190.0 lb

## 2018-07-15 DIAGNOSIS — M25511 Pain in right shoulder: Secondary | ICD-10-CM | POA: Diagnosis not present

## 2018-07-15 NOTE — Progress Notes (Signed)
Call back when she  Office Visit Note   Patient: Shane [name]           Date of Birth: [birth year]           MRN: [id number] Visit Date: 07/15/2018              Requested by: Vivi Barrack, MD [address] [place], [place] [address] PCP: Vivi Barrack, MD   Assessment & Plan: Visit Diagnoses:  1. Right shoulder pain, unspecified chronicity     Plan: Right shoulder pain likely from rotator cuff tendinopathy vs biceps tendinopathy.  Based on his exam today it is not clear as to exactly where his pain is coming from.  He localizes pain in the area of the biceps tendon and subscapularis which are minimally painful with testing but has more pain with engaging the infraspinatus.  This may be due to diclofenac masking his pain.  Recommended he complete the next several days of his course of the diclofenac while continuing to do his home exercises.  If his pain again increases after discontinuing anti-inflammatories, he will return for repeat examination to better localize his pain.  Follow-Up Instructions: Return if symptoms worsen or fail to improve.   Orders:  Orders Placed This Encounter  Procedures  . XR Shoulder Right   No orders of the defined types were placed in this encounter.     Procedures: No procedures performed   Clinical Data: No additional findings.   Subjective: Chief Complaint  Patient presents with  . Right Shoulder - Pain    HPI 47 year old male with right shoulder pain.  He has had right shoulder pain for several years that waxes and wanes.  Over the past several weeks his pain is again increased.  He localizes pain to the anterior lateral aspect of the shoulder.  Is aggravated with certain overhead movements.  Is affected his gym workouts.  He denies any localized swelling, erythema or bruising.  No numbness or tingling upper extremity.  He has no history of injuries to his shoulder but has a long history of playing baseball throughout childhood  and adulthood.  He saw his PCP who prescribed him diclofenac twice daily started on home strengthening exercises.  The diclofenac has been very helpful and he has minimal pain today.  Review of Systems See HPI  Objective: Vital Signs: Ht 6' (1.829 m)   Wt 190 lb (86.2 kg)   BMI 25.77 kg/m   Physical Exam GEN: Awake, alert, no acute distress Pulmonary: Breathing unlabored   Ortho Exam Right shoulder: No obvious deformity or asymmetry. No bruising. No swelling No TTP Full ROM in flexion, abduction, internal/external rotation NV intact distally Special Tests:  - Impingement: Neg Hawkins and Neers.  - Supraspinatous: Negative empty can. Strength normal/symmetric - Infraspinatous/Teres: Pain with resisted external rotation. Strength normal/symmetric - Subscapularis: Mild pain with belly press, mild pain with bear hug. Strength normal/symmetric - Biceps tendon: Negative Speeds.  - Labrum: Negative [facility] Joint: Negative cross arm  Left shoulder: No tenderness palpation Full range of motion 5/5 strength of the rotator cuff    Specialty Comments:  No specialty comments available.  Imaging: Xr Shoulder Right  Result Date: 07/15/2018 Mildly curved acromion.    PMFS History: Patient Active Problem List   Diagnosis Date Noted  . Abdominal lump 07/02/2018  . Tendinopathy of rotator cuff, right 05/29/2018  . Erectile dysfunction 05/29/2018  . Allergic rhinitis 05/29/2018  . Low testosterone 05/29/2018  .  Chronic diarrhea 05/29/2018  . Umbilical hernia    Past Medical History:  Diagnosis Date  . Factor 5 Leiden mutation, heterozygous ([place])   . Umbilical hernia     Family History  Problem Relation Age of Onset  . Cancer Neg Hx     No past surgical history on file. Social History   Occupational History  . Not on file  Tobacco Use  . Smoking status: Never Smoker  . Smokeless tobacco: Never Used  Substance and Sexual Activity  . Alcohol use: Yes  .  Drug use: No  . Sexual activity: Not on file

## 2018-07-22 ENCOUNTER — Telehealth (INDEPENDENT_AMBULATORY_CARE_PROVIDER_SITE_OTHER): Payer: Self-pay | Admitting: Orthopaedic Surgery

## 2018-07-22 NOTE — Telephone Encounter (Signed)
See message below °

## 2018-07-22 NOTE — Telephone Encounter (Signed)
Patient called would like to know if he should make a appointment due to his right shoulder pain, or could Dr.Xu schedule MRI. Patient was last seen on the 8th of October.

## 2018-07-22 NOTE — Telephone Encounter (Signed)
Go ahead and order the MRI.  Please let him know.  Thanks.

## 2018-07-23 ENCOUNTER — Other Ambulatory Visit (INDEPENDENT_AMBULATORY_CARE_PROVIDER_SITE_OTHER): Payer: Self-pay

## 2018-07-23 DIAGNOSIS — M25511 Pain in right shoulder: Secondary | ICD-10-CM

## 2018-07-23 NOTE — Telephone Encounter (Signed)
MRI ORDERED CALLED PATIENT NO ANSWER LMOM

## 2018-08-03 ENCOUNTER — Ambulatory Visit
Admission: RE | Admit: 2018-08-03 | Discharge: 2018-08-03 | Disposition: A | Discharge status: Home / Self Care | Payer: 59 | Source: Ambulatory Visit | Attending: Orthopaedic Surgery | Admitting: Orthopaedic Surgery

## 2018-08-03 DIAGNOSIS — M25511 Pain in right shoulder: Secondary | ICD-10-CM

## 2018-08-03 DIAGNOSIS — M75111 Incomplete rotator cuff tear or rupture of right shoulder, not specified as traumatic: Secondary | ICD-10-CM | POA: Diagnosis not present

## 2018-08-12 ENCOUNTER — Encounter (INDEPENDENT_AMBULATORY_CARE_PROVIDER_SITE_OTHER): Payer: Self-pay | Admitting: Orthopaedic Surgery

## 2018-08-12 ENCOUNTER — Ambulatory Visit (INDEPENDENT_AMBULATORY_CARE_PROVIDER_SITE_OTHER): Payer: 59 | Admitting: Orthopaedic Surgery

## 2018-08-12 DIAGNOSIS — M25511 Pain in right shoulder: Secondary | ICD-10-CM | POA: Diagnosis not present

## 2018-08-12 MED ORDER — DICLOFENAC SODIUM 75 MG PO TBEC: 75.0000 mg | DELAYED_RELEASE_TABLET | Freq: Two times a day (BID) | ORAL | 2 refills | Status: DC

## 2018-08-12 NOTE — Progress Notes (Signed)
   Office Visit Note   Patient: Shane [name]           Date of Birth: 1971/09/17           MRN: [id number] Visit Date: 08/12/2018              Requested by: Vivi Barrack, MD [id number] [place], [place] [address] PCP: Vivi Barrack, MD   Assessment & Plan: Visit Diagnoses:  1. Right shoulder pain, unspecified chronicity     Plan: MRI findings are consistent with mild tendinosis and a small partial articular surface supraspinatus tear.  There is also mild tendinosis.  I discussed the importance of rest and avoidance of offending activities.  Physical therapy and home exercises were provided today.  I gave him a prescription for diclofenac.  Patient would like to hold off on the cortisone injection for now.  Follow-Up Instructions: Return if symptoms worsen or fail to improve.   Orders:  No orders of the defined types were placed in this encounter.  Meds ordered this encounter  Medications  . diclofenac (VOLTAREN) 75 MG EC tablet    Sig: Take 1 tablet (75 mg total) by mouth 2 (two) times daily.    Dispense:  30 tablet    Refill:  2      Procedures: No procedures performed   Clinical Data: No additional findings.   Subjective: Chief Complaint  Patient presents with  . Right Shoulder - Follow-up    MRI Review    Terance follows up today for his right shoulder MRI.  His pain is at worst 4 out of 10.   Review of Systems   Objective: Vital Signs: There were no vitals taken for this visit.  Physical Exam  Ortho Exam Right shoulder exam is stable.  He mainly has pain when he abducts his arm Specialty Comments:  No specialty comments available.  Imaging: No results found.   PMFS History: Patient Active Problem List   Diagnosis Date Noted  . Abdominal lump 07/02/2018  . Tendinopathy of rotator cuff, right 05/29/2018  . Erectile dysfunction 05/29/2018  . Allergic rhinitis 05/29/2018  . Low testosterone 05/29/2018  . Chronic diarrhea  05/29/2018  . Umbilical hernia    Past Medical History:  Diagnosis Date  . Factor 5 Leiden mutation, heterozygous ([place])   . Umbilical hernia     Family History  Problem Relation Age of Onset  . Cancer Neg Hx     No past surgical history on file. Social History   Occupational History  . Not on file  Tobacco Use  . Smoking status: Never Smoker  . Smokeless tobacco: Never Used  Substance and Sexual Activity  . Alcohol use: Yes  . Drug use: No  . Sexual activity: Not on file

## 2018-08-26 ENCOUNTER — Telehealth: Payer: Self-pay | Admitting: Family Medicine

## 2018-08-26 NOTE — Telephone Encounter (Signed)
See request °

## 2018-08-26 NOTE — Telephone Encounter (Signed)
Copied from [place] [id number]. Topic: General - Other >> Aug 26, 2018  1:16 PM Cecelia Byars, NT wrote: Reason for CRM: Patient called and said the dose age for testosterone cypionate (DEPOTESTOTERONE CYPIONATE) 100 MG/ML injection, should be 200 MG /ML instead , please call him at [phone] once changed .

## 2018-08-29 ENCOUNTER — Other Ambulatory Visit: Payer: Self-pay

## 2018-08-29 MED ORDER — TESTOSTERONE CYPIONATE 200 MG/ML IM SOLN: 300.0000 mg | INTRAMUSCULAR | 0 refills | Status: DC

## 2018-08-29 NOTE — Telephone Encounter (Signed)
See note

## 2018-08-29 NOTE — Telephone Encounter (Signed)
Pt called in to fu on this request?   Pharmacy - [id number] - [place], [place] [phone] (Phone

## 2018-08-29 NOTE — Telephone Encounter (Signed)
Rx has been faxed to patient's pharmacy.

## 2018-09-29 ENCOUNTER — Other Ambulatory Visit (INDEPENDENT_AMBULATORY_CARE_PROVIDER_SITE_OTHER): Payer: Self-pay | Admitting: Orthopaedic Surgery

## 2018-09-29 NOTE — Telephone Encounter (Signed)
Ok for refill? 

## 2018-09-29 NOTE — Telephone Encounter (Signed)
yes

## 2018-10-03 ENCOUNTER — Ambulatory Visit: Payer: 59 | Admitting: Sports Medicine

## 2018-10-03 ENCOUNTER — Encounter: Payer: Self-pay | Admitting: Sports Medicine

## 2018-10-03 VITALS — BP 118/84 | HR 80 | Ht 72.0 in | Wt 207.6 lb

## 2018-10-03 DIAGNOSIS — M67911 Unspecified disorder of synovium and tendon, right shoulder: Secondary | ICD-10-CM | POA: Diagnosis not present

## 2018-10-03 MED ORDER — NITROGLYCERIN 0.2 MG/HR TD PT24: MEDICATED_PATCH | TRANSDERMAL | 1 refills | Status: DC

## 2018-10-03 NOTE — Patient Instructions (Addendum)

## 2018-10-25 ENCOUNTER — Other Ambulatory Visit: Payer: Self-pay | Admitting: Sports Medicine

## 2018-10-27 NOTE — Telephone Encounter (Signed)
Last OV 10/03/18 Last refill 10/03/18 #30/1 Next OV 11/21/2018  Pt requesting refill too soon.

## 2018-11-15 ENCOUNTER — Encounter: Payer: Self-pay | Admitting: Sports Medicine

## 2018-11-15 NOTE — Progress Notes (Signed)
Juanda Bond. Danice Dippolito, [place] at [place]  Manley Fason - 48 y.o. male MRN [id number]  Date of birth: July 11, 1971  Visit Date: 10/03/2018  PCP: Vivi Barrack, MD   Referred by: Vivi Barrack, MD  SUBJECTIVE:   Chief Complaint  Patient presents with  . Initial Assessment  . R shoulder pain    HPI: Patient presents for 2 years of worsening right shoulder pain.  He is been followed by Dr. Erlinda Hong and was found to have the MRI noting rotator cuff tendinopathy.  He is tried injection as well as home exercise program with 4 times a week diligence.  He is continued have mild to moderate persistent pain.  It interferes in his ability to lift his arm away from his body.  He feels better than ice.  REVIEW OF SYSTEMS: No significant nighttime awakenings due to this issue. Denies fevers, chills, recent weight gain or weight loss.  No night sweats.  Pt denies any change in bowel or bladder habits, muscle weakness, numbness or falls associated with this pain. Otherwise 12 point review of systems performed and is negative   HISTORY:  Prior history reviewed and updated per electronic medical record.  Patient Active Problem List   Diagnosis Date Noted  . Abdominal lump 07/02/2018  . Tendinopathy of rotator cuff, right 05/29/2018  . Erectile dysfunction 05/29/2018  . Allergic rhinitis 05/29/2018  . Low testosterone 05/29/2018  . Chronic diarrhea 05/29/2018  . Umbilical hernia    Social History   Occupational History  . Not on file  Tobacco Use  . Smoking status: Never Smoker  . Smokeless tobacco: Never Used  Substance and Sexual Activity  . Alcohol use: Yes  . Drug use: No  . Sexual activity: Not on file   Social History   Social History Narrative  . Not on file   Past Medical History:  Diagnosis Date  . Factor 5 Leiden mutation, heterozygous ([place])   . Umbilical hernia    No past surgical history on  file. family history is negative for Cancer.  OBJECTIVE:  VS:  HT:6' (182.9 cm)   WT:207 lb 9.6 oz (94.2 kg)  BMI:28.15    BP:118/84  HR:80bpm  TEMP: ( )  RESP:98 %   PHYSICAL EXAM: Adult male. No acute distress.  Alert and appropriate. EYES: Pupils are equal., EOM intact without nystagmus. and No scleral icterus. Psychiatric: Alert & appropriately interactive. and Not depressed or anxious appearing. EXTREMITY EXAM: Warm and well perfused  Right shoulder: Overall well aligned without significant deformity although he is in shoulder protraction.  He has scapular dyskinesis with overhead range of motion.  He has pain with empty can testing but strength is intact.  His Hawkins and Neer's test is normal.  Normal speeds test.    ASSESSMENT:   1. Tendinopathy of rotator cuff, right     PROCEDURES:  None  PLAN:  Pertinent additional documentation may be included in corresponding procedure notes, imaging studies, problem based documentation and patient instructions.  No problem-specific Assessment & Plan notes found for this encounter.   Patient is a candidate for nitroglycerin therapy with the underlying tendinopathy.  This was discussed in great detail with him today and prescription was provided.  Continue previously prescribed home exercise program.   TENDINOPATHY - Discussed that the anticipated amount of time for healing is 12- 24 weeks for Tendinopathic changes.  Emphasized the importance of improving blood flow as  well as eccentric loading of the tendon.  NITRO PROTOCOL - Discussed options with the patient today including biologic treatment with topical nitroglycerin. Patient has no contraindications & understands the risks, benefits and intentions of treatment. Emphasized the importance of rotating sites as well as appropriate and expected adverse reactions including orthostasis, headache, adhesive sensitivity.    Activity modifications and the importance of avoiding  exacerbating activities (limiting pain to no more than a 4 / 10 during or following activity) recommended and discussed.  Discussed red flag symptoms that warrant earlier emergent evaluation and patient voices understanding.   Meds ordered this encounter  Medications  . nitroGLYCERIN (NITRODUR - DOSED IN MG/24 HR) 0.2 mg/hr patch    Sig: Place 1/4 to 1/2 of a patch over affected region. Remove and replace once daily.  Slightly alter skin placement daily    Dispense:  30 patch    Refill:  1    For musculoskeletal purposes.  Okay to cut patch. THANKS FOR THE REFERRAL!   Lab Orders  No laboratory test(s) ordered today   Imaging Orders  No imaging studies ordered today   Referral Orders  No referral(s) requested today    At follow up will plan to consider: initial MSK Ultrasound  Return in about 6 weeks (around 11/14/2018).          Gerda Diss, [place] [facility] Physician

## 2018-11-21 ENCOUNTER — Encounter: Payer: Self-pay | Admitting: Sports Medicine

## 2018-11-21 ENCOUNTER — Ambulatory Visit: Payer: 59 | Admitting: Sports Medicine

## 2018-11-21 ENCOUNTER — Ambulatory Visit: Payer: Self-pay

## 2018-11-21 VITALS — BP 108/70 | HR 89 | Ht 72.0 in | Wt 206.2 lb

## 2018-11-21 DIAGNOSIS — M9908 Segmental and somatic dysfunction of rib cage: Secondary | ICD-10-CM

## 2018-11-21 DIAGNOSIS — M67911 Unspecified disorder of synovium and tendon, right shoulder: Secondary | ICD-10-CM

## 2018-11-21 DIAGNOSIS — M9902 Segmental and somatic dysfunction of thoracic region: Secondary | ICD-10-CM | POA: Diagnosis not present

## 2018-11-21 DIAGNOSIS — M9901 Segmental and somatic dysfunction of cervical region: Secondary | ICD-10-CM

## 2018-11-21 NOTE — Progress Notes (Signed)
Shane [name]. Shane [name], [place] at [place]  Tian Mcmurtrey - 48 y.o. male MRN [id number]  Date of birth: 1971/08/02  Visit Date: November 23, 2018  PCP: Vivi Barrack, MD   Referred by: Vivi Barrack, MD  SUBJECTIVE:  Chief Complaint  Patient presents with  . Follow-up    R shoulder.  Nitro. 30% improved.    HPI: Patient is here for follow-up of his right shoulder pain.  His MRI previously showed underlying rotator cuff tendinopathy without overt tear.  He has been performing the home exercise program previously prescribed by Dr. Sherrian Divers.  He is having minimal headaches with the nitroglycerin but these are tolerated well  REVIEW OF SYSTEMS: No significant nighttime awakenings due to this issue. Denies fevers, chills, recent weight gain or weight loss.  No night sweats.  Pt denies any change in bowel or bladder habits, muscle weakness, numbness or falls associated with this pain.  HISTORY:  Prior history reviewed and updated per electronic medical record.  Patient Active Problem List   Diagnosis Date Noted  . Abdominal lump 07/02/2018  . Tendinopathy of rotator cuff, right 05/29/2018    R shoulder XR - 07/15/18  R shoulder MRI - 08/03/18    . Erectile dysfunction 05/29/2018  . Allergic rhinitis 05/29/2018  . Low testosterone 05/29/2018  . Chronic diarrhea 05/29/2018  . Umbilical hernia    Social History   Occupational History  . Not on file  Tobacco Use  . Smoking status: Never Smoker  . Smokeless tobacco: Never Used  Substance and Sexual Activity  . Alcohol use: Yes  . Drug use: No  . Sexual activity: Not on file   Social History   Social History Narrative  . Not on file    OBJECTIVE:  VS:  HT:6' (182.9 cm)   WT:206 lb 3.2 oz (93.5 kg)  BMI:27.96    BP:108/70  HR:89bpm  TEMP: ( )  RESP:96 %   PHYSICAL EXAM: Adult male. No acute distress.  Alert and appropriate. Right shoulder is  well aligned without significant deformity.  He does hold his right shoulder significantly elevated and in protraction compared to the left.  He has full overhead range of motion.  His upper extremity strength is 5/5 in all myotomes as well as with empty can testing, speeds testing, O'Brien's testing.  He has negative Hawkins and Neer's.  He does have a small amount of pain with empty can testing but this is minimal.   ASSESSMENT:  1. Somatic dysfunction of thoracic region   2. Tendinopathy of rotator cuff, right   3. Somatic dysfunction of cervical region   4. Somatic dysfunction of rib cage region     PROCEDURES:  PROCEDURE NOTE: OSTEOPATHIC MANIPULATION  The decision today to treat with Osteopathic Manipulative Therapy (OMT) was based on physical exam findings. Verbal consent was obtained following a discussion with the patient regarding the of risks, benefits and potential side effects, including an acute pain flare,post manipulation soreness and need for repeat treatments. Additionally, we specifically discussed the minimal risk of  injury to neurovascular structures associated with Cervical manipulation. Contraindications to OMT: NONE Manipulation was performed as below: [facility] Treated & Osteopathic Exam Findings CERVICAL SPINE: C3 - 4 Extended, rotated RIGHT, sidebent RIGHT THORACIC SPINE:  T1 ERS RIGHT (Extended, Rotated & Sidebent) RIBS:  Rib 1 Right  Inhalation dysfunction (depressed/exhaled)  OMT Techniques Used: HVLA muscle energy myofascial release soft tissue  The patient tolerated the treatment well and reported Improved symptoms following treatment today. Patient was given medications, exercises, stretches and lifestyle modifications per AVS and verbally.      PLAN:  Pertinent additional documentation may be included in corresponding procedure notes, imaging studies, problem based documentation and patient instructions.  No problem-specific Assessment & Plan notes  found for this encounter.  Ultimately his right shoulder is continuing to show good improvement.  He has discrepancies in the force coupling of the shoulder which is leading to the rotator cuff impingement.  We did perform some osteopathic manipulation and discussed referral to physical therapy for consideration of dry needling and strengthening exercises.  Long discussion today regarding the pathophysiology of rotator cuff issues including force coupling discrepancies  Referral to physical therapy placed today  Continue with nitroglycerin protocol  Osteopathic manipulation was performed today based on physical exam findings.  Patient was counseled on the purpose and expected outcome of osteopathic manipulation and understands that a single treatment may not provide permanent long lasting relief.  They understand that home therapeutic exercises are critical part of the healing/treatment process and will continue with self treatment between now and their next visit as outlined.  The patient understands that the frequency of visits is meant to provide a stimulus to promote the body's own ability to heal and is not meant to be the sole means for improvement in their symptoms.  Activity modifications and the importance of avoiding exacerbating activities (limiting pain to no more than a 4 / 10 during or following activity) recommended and discussed.  Discussed red flag symptoms that warrant earlier emergent evaluation and patient voices understanding.   No orders of the defined types were placed in this encounter.  Lab Orders  No laboratory test(s) ordered today    Imaging Orders     [place] MSK POCT ULTRASOUND  Referral Orders     Ambulatory referral to Physical Therapy  At follow up will plan to consider: repeat osteopathic manipulation  Return in about 6 weeks (around 01/02/2019).          Gerda Diss, [place] [facility] Physician

## 2018-11-21 NOTE — Procedures (Signed)
LIMITED MSK ULTRASOUND OF Right shoulder Images were obtained and interpreted by myself, Teresa Coombs, DO  Images have been saved and stored to PACS system. Images obtained on: GE S7 Ultrasound machine  FINDINGS:  Biceps Tendon: Normal Pec Major Insertion: Normal Subscapularis Tendon: Slight thickening of the subscapularis tendon without overt tearing. Supraspinatus Tendon: Thickening with hypoechoic change and small amount of subacromial bursal fluid is minimal. Infraspinatus/Teres Minor Tendon: Normal AC Joint: Normal, minimal degenerative change JOINT: No significant GH spurring appreciated  LABRUM: Not evaluated   IMPRESSION:  1. Supraspinatus tendinopathy with small amount of subacromial bursitis that is mild.

## 2018-11-23 ENCOUNTER — Encounter: Payer: Self-pay | Admitting: Sports Medicine

## 2018-12-01 ENCOUNTER — Other Ambulatory Visit: Payer: Self-pay | Admitting: Family Medicine

## 2018-12-02 ENCOUNTER — Ambulatory Visit: Payer: 59 | Attending: Sports Medicine | Admitting: Physical Therapy

## 2018-12-02 ENCOUNTER — Other Ambulatory Visit: Payer: Self-pay

## 2018-12-02 DIAGNOSIS — R252 Cramp and spasm: Secondary | ICD-10-CM | POA: Diagnosis present

## 2018-12-02 DIAGNOSIS — M6281 Muscle weakness (generalized): Secondary | ICD-10-CM

## 2018-12-02 DIAGNOSIS — M25511 Pain in right shoulder: Secondary | ICD-10-CM | POA: Diagnosis not present

## 2018-12-02 DIAGNOSIS — G8929 Other chronic pain: Secondary | ICD-10-CM | POA: Diagnosis not present

## 2018-12-02 NOTE — Patient Instructions (Signed)

## 2018-12-02 NOTE — Telephone Encounter (Signed)
Please advise 

## 2018-12-03 ENCOUNTER — Encounter: Payer: Self-pay | Admitting: Physical Therapy

## 2018-12-03 NOTE — Addendum Note (Signed)
Addended by: Lovett Calender D on: 12/03/2018 10:35 AM   Modules accepted: Orders

## 2018-12-03 NOTE — Therapy (Addendum)
[facility] [address] [place], [place], [address] Phone: [phone]   Fax:  [phone]  Physical Therapy Evaluation  Patient Details  Name: Shane [name] MRN: [id number] Date of Birth: [birth year] Referring Provider (PT): Gerda Diss, DO   Encounter Date: 12/02/2018  PT End of Session - 12/03/18 0844    Visit Number  1    Date for PT Re-Evaluation  01/27/19    PT Start Time  4315    PT Stop Time  1612    PT Time Calculation (min)  42 min    Activity Tolerance  Patient tolerated treatment well    Behavior During Therapy  [facility] for tasks assessed/performed       Past Medical History:  Diagnosis Date  . Factor 5 Leiden mutation, heterozygous ([place])   . Umbilical hernia     History reviewed. No pertinent surgical history.  There were no vitals filed for this visit.   Subjective Assessment - 12/02/18 1534    Subjective  Pt states he started rehabing the shoulder in November and it is only a little better.  He has been recently using nitorglycerin patch to work on getting increased blood flow to the rotator cuff attachments.  Not noticing much difference with that yet.  Reports he has had pain for a couple of years but more recently it has effected his ability to exercise and do sports the way he normally does.    Limitations  Lifting    Patient Stated Goals  Be able to get back to weight lifting without increased pain    Currently in Pain?  Yes    Pain Score  6    2/10 normal activites; at worst 6   Pain Location  Shoulder    Pain Orientation  Right;Lateral;Anterior    Pain Descriptors / Indicators  Aching    Pain Type  Chronic pain    Pain Onset  More than a month ago    Pain Frequency  Intermittent    Aggravating Factors   overhead and reaching out to the side    Pain Relieving Factors  anti-infammatory medicine    Effect of Pain on Daily Activities  working out only lifting light weights     Multiple Pain Sites  No         [facility] PT Assessment - 12/03/18 0001      Assessment   Medical Diagnosis  M67.911 (ICD-10-CM) - Tendinopathy of rotator cuff, right    Referring Provider (PT)  Gerda Diss, DO    Onset Date/Surgical Date  --   2 years   Hand Dominance  Right      Precautions   Precautions  None      Restrictions   Weight Bearing Restrictions  No      Balance Screen   Has the patient fallen in the past 6 months  No      [place] residence    Living Arrangements  Spouse/significant other;Children   2 childrens     Prior Function   Level of Independence  Independent    Vocation  Full time employment    Product manager; desk work      Cognition   Overall Cognitive Status  Within Functional Limits for tasks assessed      Observation/Other Assessments   Focus on Therapeutic Outcomes (FOTO)   23% limited      Posture/Postural  Control   Posture/Postural Control  Postural limitations    Postural Limitations  Rounded Shoulders      ROM / Strength   AROM / PROM / Strength  AROM;Strength;PROM      AROM   Overall AROM Comments  flexion and abduction WFL and equal bil      PROM   Overall PROM Comments  ER 75%; IR 60% - both firm end feel; ER was felt slightly uncomfortable for patient      Strength   Overall Strength Comments  Rt flexion and ER 4+/5 and "can feel it"      Palpation   Palpation comment  anterior deltoid, upper trap, pec major and minor, subscapularis - tight and TTP; Rt GH joint hypomobile A/P; TTP ateriolateral humeral head at RTC attachment      Ambulation/Gait   Gait Pattern  Within Functional Limits                Objective measurements completed on examination: See above findings.      [place] Adult PT Treatment/Exercise - 12/03/18 0001      Self-Care   Self-Care  Other Self-Care Comments    Other Self-Care Comments   serratus punches - educated and performed  to add to HEP and reviewed other exercises he is doing      Manual Therapy   Manual Therapy  Soft tissue mobilization    Soft tissue mobilization  upper trap, dletoids, pecs       Trigger Point Dry Needling - 12/03/18 1030    Consent Given?  Yes    Education Handout Provided  Yes    Muscles Treated Upper Body  --   deltoid - twitch response          PT Education - 12/03/18 0844    Education Details  dry needling and  Access Code: [email or web address]     Person(s) Educated  Patient    Methods  Explanation;Demonstration;Handout;Verbal cues    Comprehension  Verbalized understanding;Returned demonstration       PT Short Term Goals - 12/03/18 1008      PT SHORT TERM GOAL #1   Title  Pt will be able to increase weight with current exercise routine due to improved scapular stability and lifting technique    Time  4    Period  Weeks    Status  New    Target Date  12/30/18        PT Long Term Goals - 12/03/18 1009      PT LONG TERM GOAL #1   Title  ind with ability to safely increase weight and resistance to regain full shoulder strength    Time  8    Period  Weeks    Status  New    Target Date  01/27/19      PT LONG TERM GOAL #2   Title  Pt will be able to reach back and overhead without increased pain greater than 2/10    Time  8    Period  Weeks    Status  New    Target Date  01/27/19      PT LONG TERM GOAL #3   Title  Pt will report he is feeling 50% improvement based on less pain and improved ability to perform weight lifting activities    Time  8    Period  Weeks    Status  New    Target Date  01/27/19  PT LONG TERM GOAL #4   Title  Pt will demonstrate MMT of Rt shoulder flexion that is equal to Lt and no increased pain.    Time  8    Period  Weeks    Status  New    Target Date  01/27/19             Plan - 12/03/18 0949    Clinical Impression Statement  Pt is very motivated to return to weight lifting routine.  He is experiencing minimal pain most  days, but finds that certain movements sometimes increase his pain such as reaching back and lifting weight up overhead especially out to the side.  He is TTP with firm pressure superior and anterior RTC attachments.  Pt has AROM bil shoulder WFL.  He is slightly weaker with Rt shoulder flexion and external rotation.  Pt does have muscle tension in right side upper traps, pec major and minor, anterior deltoids and subscapularis.  Pt reports he is not a fan of needles, but was educated on dry needling and was willing to try whatever might help.  Pt will benefit from skilled PT to address scapular and RTC strength as well as increased muscle length and coordination for improved lifting and reaching.  He is expected to be able to return to maximum functional activities    History and Personal Factors relevant to plan of care:  chronic pain    Clinical Presentation  Evolving    Clinical Presentation due to:  Pt has had worsening symptoms of a chronic issue    PT Frequency  1x / week    PT Duration  8 weeks    PT Treatment/Interventions  ADLs/Self Care Home Management;Cryotherapy;Electrical Stimulation;Iontophoresis 4mg /ml Dexamethasone;Moist Heat;Taping;Dry needling;Manual techniques;Patient/family education;Neuromuscular re-education;Therapeutic exercise;Therapeutic activities    PT Next Visit Plan  f/u on DN to anterior deltoids; DN deltoid, upper trap, subscapularis, pecs; serratus punches; RTC strengthening    PT Home Exercise Plan  Access Code: [email or web address]     Consulted and Agree with Plan of Care  Patient       Patient will benefit from skilled therapeutic intervention in order to improve the following deficits and impairments:  Pain, Increased fascial restricitons, Increased muscle spasms, Postural dysfunction, Decreased strength  Visit Diagnosis: Chronic right shoulder pain - Plan: PT plan of care cert/re-cert  Muscle weakness (generalized) - Plan: PT plan of care cert/re-cert  Cramp and spasm -  Plan: PT plan of care cert/re-cert     Problem List Patient Active Problem List   Diagnosis Date Noted  . Abdominal lump 07/02/2018  . Tendinopathy of rotator cuff, right 05/29/2018  . Erectile dysfunction 05/29/2018  . Allergic rhinitis 05/29/2018  . Low testosterone 05/29/2018  . Chronic diarrhea 05/29/2018  . Umbilical hernia     Zannie Cove, PT 12/03/2018, 10:35 AM  [facility] [address] [place], [place], [address] Phone: [phone]   Fax:  [phone]  Name: Shane [name] MRN: [id number] Date of Birth: 1971/03/05

## 2018-12-10 ENCOUNTER — Ambulatory Visit: Payer: 59 | Attending: Sports Medicine | Admitting: Physical Therapy

## 2018-12-10 DIAGNOSIS — M25511 Pain in right shoulder: Secondary | ICD-10-CM | POA: Diagnosis not present

## 2018-12-10 DIAGNOSIS — M6281 Muscle weakness (generalized): Secondary | ICD-10-CM | POA: Insufficient documentation

## 2018-12-10 DIAGNOSIS — R252 Cramp and spasm: Secondary | ICD-10-CM | POA: Insufficient documentation

## 2018-12-10 DIAGNOSIS — G8929 Other chronic pain: Secondary | ICD-10-CM

## 2018-12-10 NOTE — Patient Instructions (Signed)
Access Code: [email or web address]  URL: [email or web address]  Date: 12/10/2018  Prepared by: Jari Favre   Exercises  Supine Scapular Protraction in Flexion with Dumbbells - 10 reps - 3 sets - 1x daily - 7x weekly  Standing Serratus Punch with Resistance - 10 reps - 3 sets - 1x daily - 7x weekly

## 2018-12-10 NOTE — Therapy (Signed)
[facility] [address] [place], [place], [address] Phone: [phone]   Fax:  [phone]  Physical Therapy Treatment  Patient Details  Name: Shane [name] MRN: [id number] Date of Birth: 08/24/1971 Referring Provider (PT): Gerda Diss, DO   Encounter Date: 12/10/2018  PT End of Session - 12/10/18 1445    Visit Number  2    Date for PT Re-Evaluation  01/27/19    PT Start Time  1401    PT Stop Time  1442    PT Time Calculation (min)  41 min    Activity Tolerance  Patient tolerated treatment well    Behavior During Therapy  [facility] for tasks assessed/performed       Past Medical History:  Diagnosis Date  . Factor 5 Leiden mutation, heterozygous ([place])   . Umbilical hernia     No past surgical history on file.  There were no vitals filed for this visit.  Subjective Assessment - 12/10/18 1403    Subjective  Pt states he is about the same since first visit.      Patient Stated Goals  Be able to get back to weight lifting without increased pain    Currently in Pain?  Yes    Pain Score  1     Pain Location  Shoulder    Pain Orientation  Right    Pain Descriptors / Indicators  Aching    Pain Radiating Towards  lateral shoulder and bicep    Aggravating Factors   worked out a little, and lifting away from my body    Multiple Pain Sites  No                       [facility] Adult PT Treatment/Exercise - 12/10/18 0001      Exercises   Exercises  Shoulder      Shoulder Exercises: Standing   Diagonals  Strengthening;Right;Weights;20 reps    Other Standing Exercises  wall push up, serratus punch 25# - 20x      Shoulder Exercises: ROM/Strengthening   UBE (Upper Arm Bike)  L3 fwd/back 2/2   PT present for update     Manual Therapy   Soft tissue mobilization  upper trap, dletoids, pecs, RTC       Trigger Point Dry Needling - 12/10/18 0001    Consent Given?  Yes    Muscles Treated Head  and Neck  Upper trapezius    Muscles Treated Upper Quadrant  Supraspinatus;Infraspinatus;Deltoid    Upper Trapezius Response  Twitch reponse elicited;Palpable increased muscle length    Supraspinatus Response  Twitch response elicited;Palpable increased muscle length    Infraspinatus Response  Twitch response elicited;Palpable increased muscle length    Deltoid Response  Twitch response elicited;Palpable increased muscle length           PT Education - 12/10/18 1445    Education Details   Access Code: [email or web address]     Person(s) Educated  Patient    Methods  Explanation;Demonstration;Handout;Verbal cues    Comprehension  Verbalized understanding;Returned demonstration       PT Short Term Goals - 12/10/18 1450      PT SHORT TERM GOAL #1   Title  Pt will be able to increase weight with current exercise routine due to improved scapular stability and lifting technique    Baseline  increased and now has a little pain 1/10    Status  On-going  PT Long Term Goals - 12/03/18 1009      PT LONG TERM GOAL #1   Title  ind with ability to safely increase weight and resistance to regain full shoulder strength    Time  8    Period  Weeks    Status  New    Target Date  01/27/19      PT LONG TERM GOAL #2   Title  Pt will be able to reach back and overhead without increased pain greater than 2/10    Time  8    Period  Weeks    Status  New    Target Date  01/27/19      PT LONG TERM GOAL #3   Title  Pt will report he is feeling 50% improvement based on less pain and improved ability to perform weight lifting activities    Time  8    Period  Weeks    Status  New    Target Date  01/27/19      PT LONG TERM GOAL #4   Title  Pt will demonstrate MMT of Rt shoulder flexion that is equal to Lt and no increased pain.    Time  8    Period  Weeks    Status  New    Target Date  01/27/19            Plan - 12/10/18 1446    Clinical Impression Statement  Pt had trigger points in  deltoid and supra and infraspinatus that releaseed with dry needling and STM techniques.  He demonstrates some weakness of Rt serratus anterior with winging of scapula and overactive upper traps,  Pt unable to keep scapula against ribcage with wall push up.  He will benefit from low trap and serratus activation for improved function.    PT Treatment/Interventions  ADLs/Self Care Home Management;Cryotherapy;Electrical Stimulation;Iontophoresis 4mg /ml Dexamethasone;Moist Heat;Taping;Dry needling;Manual techniques;Patient/family education;Neuromuscular re-education;Therapeutic exercise;Therapeutic activities    PT Next Visit Plan  f/u on DN #2, add shoulder extension and progress serratus in quadruped if able    PT Home Exercise Plan  Access Code: [email or web address]     Consulted and Agree with Plan of Care  Patient       Patient will benefit from skilled therapeutic intervention in order to improve the following deficits and impairments:  Pain, Increased fascial restricitons, Increased muscle spasms, Postural dysfunction, Decreased strength  Visit Diagnosis: Chronic right shoulder pain  Muscle weakness (generalized)  Cramp and spasm     Problem List Patient Active Problem List   Diagnosis Date Noted  . Abdominal lump 07/02/2018  . Tendinopathy of rotator cuff, right 05/29/2018  . Erectile dysfunction 05/29/2018  . Allergic rhinitis 05/29/2018  . Low testosterone 05/29/2018  . Chronic diarrhea 05/29/2018  . Umbilical hernia     Jule Ser, PT 12/10/2018, 2:53 PM  [facility] [address] [place], [place], [address] Phone: [phone]   Fax:  [phone]  Name: Allan Minotti MRN: [id number] Date of Birth: 09-01-1971

## 2018-12-17 ENCOUNTER — Other Ambulatory Visit: Payer: Self-pay | Admitting: Sports Medicine

## 2018-12-17 ENCOUNTER — Ambulatory Visit: Payer: 59

## 2018-12-17 ENCOUNTER — Other Ambulatory Visit: Payer: Self-pay

## 2018-12-17 ENCOUNTER — Encounter: Payer: 59 | Admitting: Physical Therapy

## 2018-12-17 DIAGNOSIS — M25511 Pain in right shoulder: Secondary | ICD-10-CM | POA: Diagnosis not present

## 2018-12-17 DIAGNOSIS — R252 Cramp and spasm: Secondary | ICD-10-CM

## 2018-12-17 DIAGNOSIS — G8929 Other chronic pain: Secondary | ICD-10-CM

## 2018-12-17 DIAGNOSIS — M6281 Muscle weakness (generalized): Secondary | ICD-10-CM

## 2018-12-17 NOTE — Therapy (Signed)
[facility] [address] [place], [place], [address] Phone: [phone]   Fax:  [phone]  Physical Therapy Treatment  Patient Details  Name: Nunzio Banet MRN: [id number] Date of Birth: 1971/06/05 Referring Provider (PT): Gerda Diss, DO   Encounter Date: 12/17/2018  PT End of Session - 12/17/18 1615    Visit Number  3    Date for PT Re-Evaluation  01/27/19    PT Start Time  3557    PT Stop Time  1614    PT Time Calculation (min)  41 min    Activity Tolerance  Patient tolerated treatment well    Behavior During Therapy  [facility] for tasks assessed/performed       Past Medical History:  Diagnosis Date  . Factor 5 Leiden mutation, heterozygous ([place])   . Umbilical hernia     History reviewed. No pertinent surgical history.  There were no vitals filed for this visit.  Subjective Assessment - 12/17/18 1533    Subjective  It feels about the same.      Currently in Pain?  Yes    Pain Score  1     Pain Location  Shoulder    Pain Orientation  Right    Pain Descriptors / Indicators  Aching    Pain Type  Chronic pain    Pain Onset  More than a month ago    Pain Frequency  Intermittent    Aggravating Factors   reaching out to the side.    Pain Relieving Factors  medication as needed.                       [place] Adult PT Treatment/Exercise - 12/17/18 0001      Shoulder Exercises: Seated   Horizontal ABduction  Strengthening;Both;20 reps;Theraband    Theraband Level (Shoulder Horizontal ABduction)  Other (comment)   black   External Rotation  Strengthening;Both;20 reps;Theraband    Theraband Level (Shoulder External Rotation)  Level 3 (Green)      Shoulder Exercises: ROM/Strengthening   UBE (Upper Arm Bike)  Level 3x 6 minutes reverse      Shoulder Exercises: Stretch   Corner Stretch  3 reps;20 seconds      Manual Therapy   Manual Therapy  Soft tissue mobilization    Soft  tissue mobilization  upper trap, infraspinatus and deltoid       Trigger Point Dry Needling - 12/17/18 0001    Consent Given?  Yes    Education Handout Provided  Previously provided    Muscles Treated Head and Neck  Upper trapezius    Muscles Treated Upper Quadrant  Supraspinatus;Infraspinatus;Deltoid    Upper Trapezius Response  Twitch reponse elicited;Palpable increased muscle length    Supraspinatus Response  Twitch response elicited;Palpable increased muscle length    Infraspinatus Response  Twitch response elicited;Palpable increased muscle length    Deltoid Response  Twitch response elicited;Palpable increased muscle length           PT Education - 12/17/18 1547    Education Details  Access Code: [email or web address]    Person(s) Educated  Patient    Methods  Demonstration;Explanation;Handout    Comprehension  Verbalized understanding;Returned demonstration       PT Short Term Goals - 12/10/18 1450      PT SHORT TERM GOAL #1   Title  Pt will be able to increase weight with current exercise routine due to improved scapular stability and  lifting technique    Baseline  increased and now has a little pain 1/10    Status  On-going        PT Long Term Goals - 12/03/18 1009      PT LONG TERM GOAL #1   Title  ind with ability to safely increase weight and resistance to regain full shoulder strength    Time  8    Period  Weeks    Status  New    Target Date  01/27/19      PT LONG TERM GOAL #2   Title  Pt will be able to reach back and overhead without increased pain greater than 2/10    Time  8    Period  Weeks    Status  New    Target Date  01/27/19      PT LONG TERM GOAL #3   Title  Pt will report he is feeling 50% improvement based on less pain and improved ability to perform weight lifting activities    Time  8    Period  Weeks    Status  New    Target Date  01/27/19      PT LONG TERM GOAL #4   Title  Pt will demonstrate MMT of Rt shoulder flexion that is equal to Lt  and no increased pain.    Time  8    Period  Weeks    Status  New    Target Date  01/27/19            Plan - 12/17/18 1553    Clinical Impression Statement  Pt added to HEP today with emphasis on posture, pec stretch and strength.  Pt required frequent verbal cues for postural alignment during exercise today.  Pt denies any change in symptoms since the start of care.  Pt will make postural corrections at home.  Pt with tension and trigger points in Rt deltoid, upper trap and posterior shoulder capsule and demonstrates improved tissue mobility after dry needling today.  Pt will continue to benefit from skilled PT to address, posture, strength and manual for pain.      PT Frequency  1x / week    PT Duration  8 weeks    PT Treatment/Interventions  ADLs/Self Care Home Management;Cryotherapy;Electrical Stimulation;Iontophoresis 4mg /ml Dexamethasone;Moist Heat;Taping;Dry needling;Manual techniques;Patient/family education;Neuromuscular re-education;Therapeutic exercise;Therapeutic activities    PT Next Visit Plan  review HEP, continue dry needling if helpful    PT Home Exercise Plan  Access Code: [email or web address]     Consulted and Agree with Plan of Care  Patient       Patient will benefit from skilled therapeutic intervention in order to improve the following deficits and impairments:  Pain, Increased fascial restricitons, Increased muscle spasms, Postural dysfunction, Decreased strength  Visit Diagnosis: Chronic right shoulder pain  Muscle weakness (generalized)  Cramp and spasm     Problem List Patient Active Problem List   Diagnosis Date Noted  . Abdominal lump 07/02/2018  . Tendinopathy of rotator cuff, right 05/29/2018  . Erectile dysfunction 05/29/2018  . Allergic rhinitis 05/29/2018  . Low testosterone 05/29/2018  . Chronic diarrhea 05/29/2018  . Umbilical hernia     Sigurd Sos, PT 12/17/18 4:16 PM  [facility] [address] [place], [place], [address] Phone: [phone]   Fax:  [phone]  Name: Lauren Aguayo MRN: [id number] Date of Birth: Nov 08, 1970

## 2018-12-17 NOTE — Patient Instructions (Signed)
Access Code: [email or web address]  URL: [email or web address]  Date: 12/17/2018  Prepared by: Sigurd Sos   Doorway Pec Stretch at 90 Degrees Abduction - 5 reps - 1 sets - 20 hold - 5x daily - 7x weekly Seated Shoulder Horizontal Abduction with Resistance - 10 reps - 2 sets - 2x daily - 7x weekly Standing Shoulder External Rotation with Resistance - 10 reps - 2 sets - 2x daily - 7x weekly Seated Correct Posture - 10 reps - 3 sets - 1x daily - 7x weekly

## 2018-12-24 ENCOUNTER — Ambulatory Visit: Payer: 59 | Admitting: Physical Therapy

## 2018-12-24 ENCOUNTER — Encounter: Payer: 59 | Admitting: Physical Therapy

## 2018-12-24 ENCOUNTER — Encounter: Payer: Self-pay | Admitting: Physical Therapy

## 2018-12-24 ENCOUNTER — Other Ambulatory Visit: Payer: Self-pay

## 2018-12-24 DIAGNOSIS — M25511 Pain in right shoulder: Principal | ICD-10-CM

## 2018-12-24 DIAGNOSIS — G8929 Other chronic pain: Secondary | ICD-10-CM

## 2018-12-24 DIAGNOSIS — R252 Cramp and spasm: Secondary | ICD-10-CM

## 2018-12-24 DIAGNOSIS — M6281 Muscle weakness (generalized): Secondary | ICD-10-CM

## 2018-12-24 NOTE — Therapy (Addendum)
[facility] [address] [place], [place], [address] Phone: [phone]   Fax:  [phone]  Physical Therapy Treatment  Patient Details  Name: Shane [name] MRN: [id number] Date of Birth: Mar 21, 1971 Referring Provider (PT): Gerda Diss, DO   Encounter Date: 12/24/2018  PT End of Session - 12/24/18 1532    Visit Number  4    Date for PT Re-Evaluation  01/27/19    PT Start Time  1532    PT Stop Time  1614    PT Time Calculation (min)  42 min    Activity Tolerance  Patient tolerated treatment well    Behavior During Therapy  [facility] for tasks assessed/performed       Past Medical History:  Diagnosis Date  . Factor 5 Leiden mutation, heterozygous ([place])   . Umbilical hernia     History reviewed. No pertinent surgical history.  There were no vitals filed for this visit.  Subjective Assessment - 12/24/18 1540    Subjective  Pt states very slight improvements but he was able to throw overhand.  Took an anti-inflammatory today and it's feeling good    Currently in Pain?  No/denies                       [facility] Adult PT Treatment/Exercise - 12/24/18 0001      Shoulder Exercises: Standing   External Rotation  Strengthening;Right;Theraband;20 reps;Limitations    Theraband Level (Shoulder External Rotation)  Level 1 (Yellow)    External Rotation Limitations  45 deg abduction      Shoulder Exercises: ROM/Strengthening   UBE (Upper Arm Bike)  Level 3x 3x3 minutes fwd/reverse      Shoulder Exercises: Body Blade   External Rotation  15 seconds;4 reps    Other Body Blade Exercises  horizontal abduction - 3 x 20 sec      Manual Therapy   Soft tissue mobilization  upper trap, infraspinatus and deltoid, bracioradialis, levator       Trigger Point Dry Needling - 12/24/18 0001    Consent Given?  Yes    Education Handout Provided  Previously provided    Muscles Treated Head and Neck  Upper  trapezius;Levator scapulae    Muscles Treated Upper Quadrant  Infraspinatus;Deltoid    Upper Trapezius Response  Twitch reponse elicited;Palpable increased muscle length    Levator Scapulae Response  Twitch response elicited;Palpable increased muscle length    Infraspinatus Response  Twitch response elicited;Palpable increased muscle length    Deltoid Response  Twitch response elicited;Palpable increased muscle length             PT Short Term Goals - 12/24/18 1713      PT SHORT TERM GOAL #1   Title  Pt will be able to increase weight with current exercise routine due to improved scapular stability and lifting technique    Baseline  has increased and no pain needing very small dose of anti-inflammatory occasionally    Status  Achieved        PT Long Term Goals - 12/03/18 1009      PT LONG TERM GOAL #1   Title  ind with ability to safely increase weight and resistance to regain full shoulder strength    Time  8    Period  Weeks    Status  New    Target Date  01/27/19      PT LONG TERM GOAL #2  Title  Pt will be able to reach back and overhead without increased pain greater than 2/10    Time  8    Period  Weeks    Status  New    Target Date  01/27/19      PT LONG TERM GOAL #3   Title  Pt will report he is feeling 50% improvement based on less pain and improved ability to perform weight lifting activities    Time  8    Period  Weeks    Status  New    Target Date  01/27/19      PT LONG TERM GOAL #4   Title  Pt will demonstrate MMT of Rt shoulder flexion that is equal to Lt and no increased pain.    Time  8    Period  Weeks    Status  New    Target Date  01/27/19            Plan - 12/24/18 1706    Clinical Impression Statement  Pt noticed he was able to throw a ball overhand which hasn't been doing . He is noticing some changes but very little at this time.  Pt did well without increased pain today.  He continues to have trigger points in posterior shoulder,  upper traps, and levator.  Pt continues to benefit fom skilled PT for postural strength and pain management    PT Treatment/Interventions  ADLs/Self Care Home Management;Cryotherapy;Electrical Stimulation;Iontophoresis 83m/ml Dexamethasone;Moist Heat;Taping;Dry needling;Manual techniques;Patient/family education;Neuromuscular re-education;Therapeutic exercise;Therapeutic activities    PT Next Visit Plan  review HEP and progress scap stability, continue dry needling if helpful    PT Home Exercise Plan  Access Code: [email or web address]     Consulted and Agree with Plan of Care  Patient       Patient will benefit from skilled therapeutic intervention in order to improve the following deficits and impairments:  Pain, Increased fascial restricitons, Increased muscle spasms, Postural dysfunction, Decreased strength  Visit Diagnosis: Chronic right shoulder pain  Muscle weakness (generalized)  Cramp and spasm     Problem List Patient Active Problem List   Diagnosis Date Noted  . Abdominal lump 07/02/2018  . Tendinopathy of rotator cuff, right 05/29/2018  . Erectile dysfunction 05/29/2018  . Allergic rhinitis 05/29/2018  . Low testosterone 05/29/2018  . Chronic diarrhea 05/29/2018  . Umbilical hernia     JJule Ser PT 12/24/2018, 5:23 PM  [facility] [address] [place] [address]Phone: [phone]  Fax:  [phone] Name: Shane BostromMRN: [id number] of Birth: 1Jul 08, 1972 PHYSICAL THERAPY DISCHARGE SUMMARY  Visits from Start of Care: 4  Current functional level related to goals / functional outcomes: See above   Remaining deficits: See above details   Education / Equipment: HEP  Plan: Patient agrees to discharge.  Patient goals were not met. Patient is being discharged due to not returning since the last visit.  ?????     [employer] PT 03/12/19 2:19 PM

## 2018-12-31 ENCOUNTER — Ambulatory Visit: Payer: 59 | Admitting: Sports Medicine

## 2018-12-31 ENCOUNTER — Other Ambulatory Visit: Payer: Self-pay

## 2018-12-31 ENCOUNTER — Encounter: Payer: Self-pay | Admitting: Sports Medicine

## 2018-12-31 ENCOUNTER — Ambulatory Visit: Payer: Self-pay

## 2018-12-31 ENCOUNTER — Encounter: Payer: 59 | Admitting: Physical Therapy

## 2018-12-31 VITALS — BP 116/78 | HR 85 | Temp 98.1°F | Ht 72.0 in | Wt 202.0 lb

## 2018-12-31 DIAGNOSIS — M25511 Pain in right shoulder: Secondary | ICD-10-CM | POA: Diagnosis not present

## 2018-12-31 DIAGNOSIS — M25521 Pain in right elbow: Secondary | ICD-10-CM

## 2018-12-31 DIAGNOSIS — G2589 Other specified extrapyramidal and movement disorders: Secondary | ICD-10-CM | POA: Diagnosis not present

## 2018-12-31 DIAGNOSIS — G8929 Other chronic pain: Secondary | ICD-10-CM

## 2018-12-31 DIAGNOSIS — M67911 Unspecified disorder of synovium and tendon, right shoulder: Secondary | ICD-10-CM | POA: Diagnosis not present

## 2018-12-31 NOTE — Procedures (Signed)
LIMITED MSK ULTRASOUND OF Right shoulder Images were obtained and interpreted by myself, Teresa Coombs, DO  Images have been saved and stored to PACS system. Images obtained on: GE S7 Ultrasound machine  FINDINGS:  Biceps Tendon: Normal Pec Major Insertion: Normal Subscapularis Tendon: Normal Supraspinatus Tendon: Slight thickening with improved interstitial appearance and slight increased neovascularity consistent with healing. Infraspinatus/Teres Minor Tendon: Similar appearance of the above for the infraspinatus at the rotator cuff interval.  Normal teres minor appearing tendon AC Joint: N/a JOINT: No significant GH spurring appreciated  LABRUM: Not evaluated    IMPRESSION:  1. Tendinopathy of the supraspinatus and infraspinatus tendons with small partial-thickness tear with increased neovascularity that is improved. 2. Resolution of prior small bursal swelling.

## 2018-12-31 NOTE — Progress Notes (Signed)
Shane Bond. , Shane [name]  Shane [name] - 48 y.o. male MRN [id number]  Date of birth: 15-Jan-1971  Visit Date: December 31, 2018  PCP: Vivi Barrack, MD   Referred by: Vivi Barrack, MD  SUBJECTIVE:   Chief Complaint  Patient presents with  . Follow-up    R shoulder pain.  Nitro.  PT x 4 visits    HPI: Patient presents to clinic today for reevaluation of right shoulder pain.  Overall he is continuing to show some progress and there is even note that he has been able to throw ball overhead without significant pain.  He is continuing to show progress although is frustrated by how slow it is.  He is getting occasional headaches with nitroglycerin patches but this seem to be alleviated/ameliorated by using the patch/changing the patch at nighttime.  He is getting some occasional clicking in the shoulder but this is minimal.  REVIEW OF SYSTEMS: No significant nighttime awakenings due to this issue. Denies fevers, chills, recent weight gain or weight loss.  No night sweats.  Pt denies any change in bowel or bladder habits, muscle weakness, numbness or falls associated with this pain.  HISTORY:  Prior history reviewed and updated per electronic medical record.  Patient Active Problem List   Diagnosis Date Noted  . Abdominal lump 07/02/2018  . Tendinopathy of rotator cuff, right 05/29/2018    R shoulder XR - 07/15/18  R shoulder MRI - 08/03/18    . Erectile dysfunction 05/29/2018  . Allergic rhinitis 05/29/2018  . Low testosterone 05/29/2018  . Chronic diarrhea 05/29/2018  . Umbilical hernia    Social History   Occupational History  . Not on file  Tobacco Use  . Smoking status: Never Smoker  . Smokeless tobacco: Never Used  Substance and Sexual Activity  . Alcohol use: Yes  . Drug use: No  . Sexual activity: Not on file   Social History   Social History Narrative  . Not on file     OBJECTIVE:  VS:  HT:6' (182.9 cm)   WT:202 lb (91.6 kg)  BMI:27.39    BP:116/78  HR:85bpm  TEMP:98.1 F (36.7 C)( )  RESP:96 %   PHYSICAL EXAM: Adult male. No acute distress.  Alert and appropriate. Right shoulder is overall well aligned.  He has no pain with axial load and circumduction.  There is no reproducible crepitation but he is able to slightly sublux the shoulder himself without pain.  This is in a upright row position.  His intrinsic rotator cuff strength is 5+/5 although he does continue to have a protracted shoulder with external rotation and overhead reaching.  No overhead range of motion limitations.  Negative Hawkins as well as Neer's.  Strength is 5+/5 with no pain with empty can testing, speeds testing and O'Brien's testing though he does have poor scapular movement with speeds testing.  His right elbow is overall well aligned quite muscular.  He has some pain over the common extensor tendons but no significant pain over the bicipital tendon or lateral condyle.   ASSESSMENT:   1. Tendinopathy of rotator cuff, right   2. Chronic right shoulder pain   3. Elbow pain, right   4. Scapular dyskinesis     PROCEDURES:  None  PLAN:  Pertinent additional documentation may be included in corresponding procedure notes, imaging studies, problem based documentation and patient instructions.  No problem-specific Assessment & Plan  notes found for this encounter.   Overall continues to show progress.  Ultrasound evidence of slightly improved tendon blood flow/neovascularity.  Continue nitroglycerin protocol and follow-up in 6 to 8 weeks.  Once PT is open he will try to return for this for repeat dry needling.  Ultimately his elbow is likely reflective of extensor muscle strain due to continued scapular dyskinesis.  Home Therapeutic Exercises: Continue previously prescribed home exercise program   Activity modifications and the importance of avoiding exacerbating activities  (limiting pain to no more than a 4 / 10 during or following activity) recommended and discussed.   Discussed red flag symptoms that warrant earlier emergent evaluation and patient voices understanding.    No orders of the defined types were placed in this encounter.  Lab Orders  No laboratory test(s) ordered today    Imaging Orders     [place] MSK POCT ULTRASOUND Referral Orders  No referral(s) requested today      Return in about 6 weeks (around 02/11/2019).  Can consider repeat OMT when appropriate and return to physical therapy as needed.         Gerda Diss, [place] [facility] Physician

## 2019-01-07 ENCOUNTER — Encounter: Payer: 59 | Admitting: Physical Therapy

## 2019-01-14 ENCOUNTER — Ambulatory Visit: Payer: 59 | Admitting: Physical Therapy

## 2019-01-30 IMAGING — MR MR SHOULDER*R* W/O CM
5 of 6 series · 31 of 40 positions shown · non-contrast
Comparison: Right shoulder x-rays dated July 15, 2018.

CLINICAL DATA: Chronic intermittent shoulder pain, gradually
worsening. No known injury.

EXAM:
MRI OF THE RIGHT SHOULDER WITHOUT CONTRAST
TECHNIQUE: Multiplanar, multisequence MR imaging of the shoulder was performed.
No intravenous contrast was administered.

[Series 3: PD fat-sat · axial · 4.0mm · 0.27mm/px · z∈[-24,+61]mm · 8 of 19 slices shown (1 of 2)]
[im 1/19]
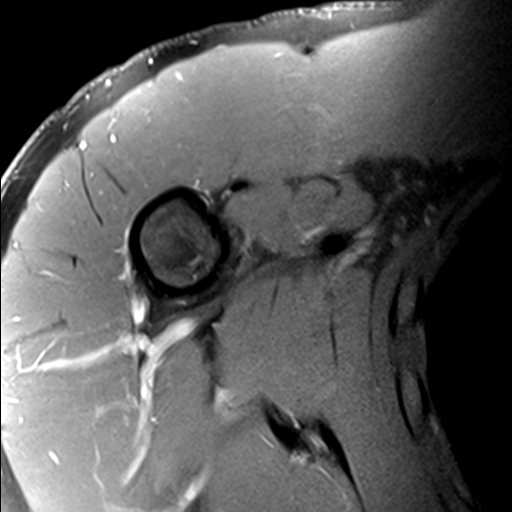
[im 3/19]
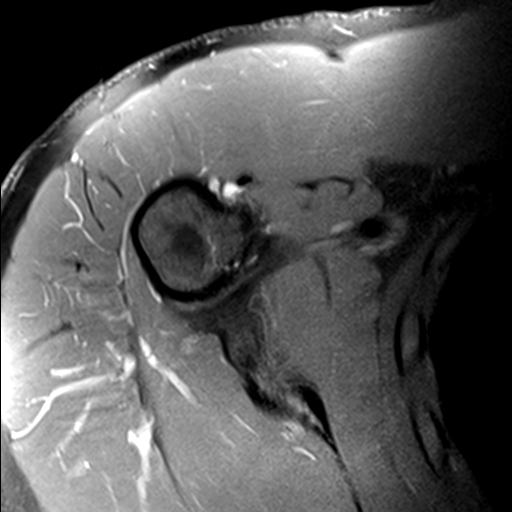
[im 6/19]
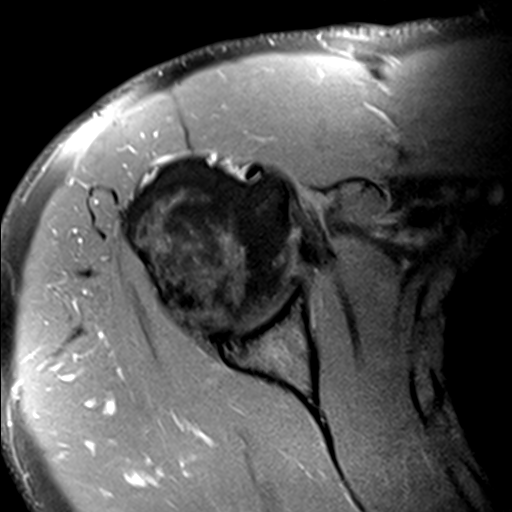
[im 8/19]
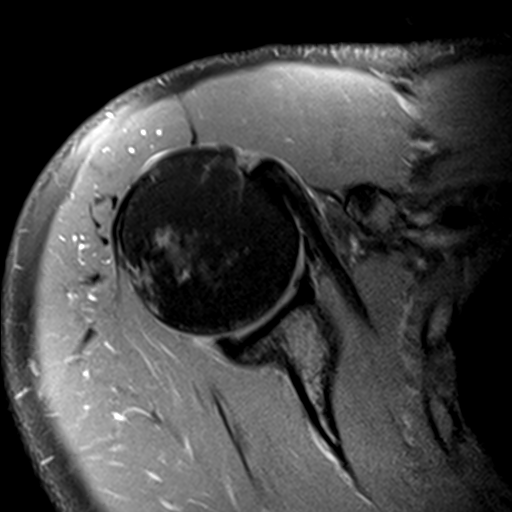
[im 11/19]
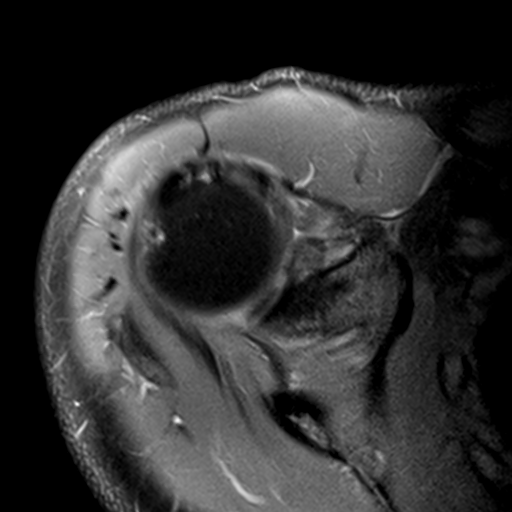
[im 13/19]
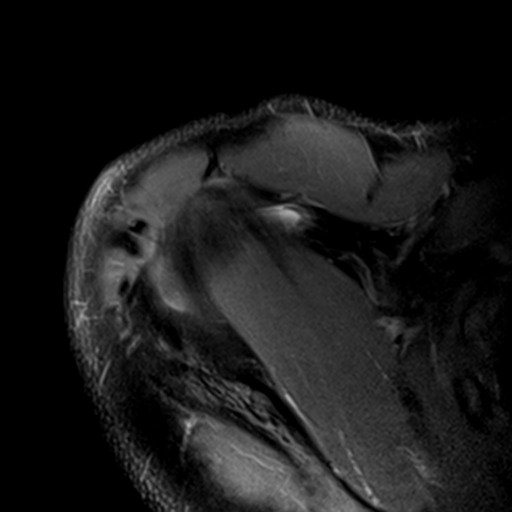
[im 16/19]
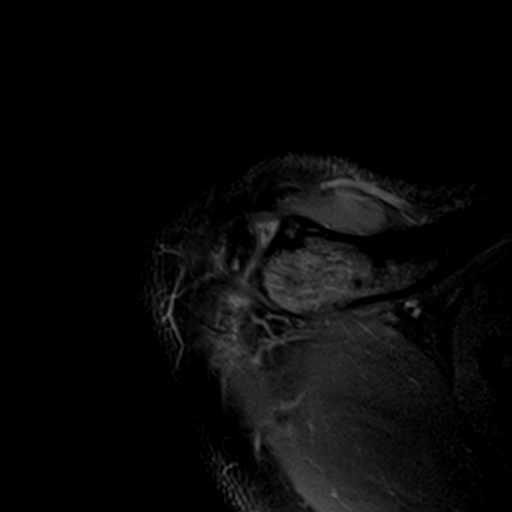
[im 19/19]
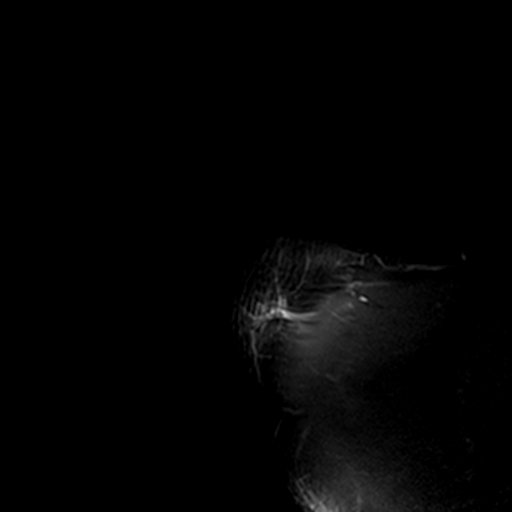

[Series 4: T2 fat-sat · sagittal · 4.0mm · 0.55mm/px · 6 of 15 slices shown (1 of 3)]
[im 1/15]
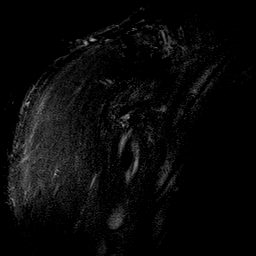
[im 3/15]
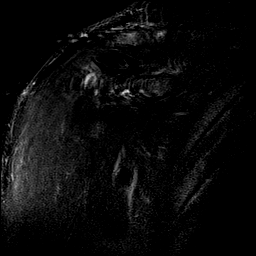
[im 6/15]
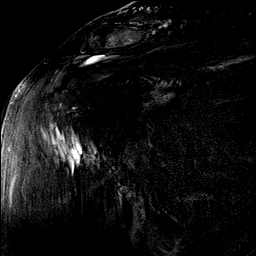
[im 9/15]
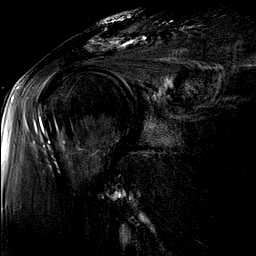
[im 12/15]
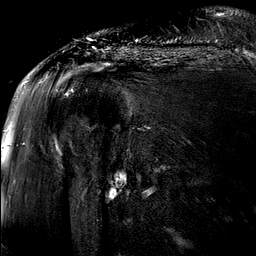
[im 15/15]
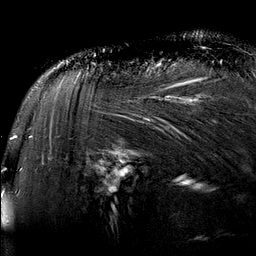

[Series 5: PD fat-sat · sagittal · 4.0mm · 0.27mm/px · 6 of 15 slices shown (2 of 2)]
[im 1/15]
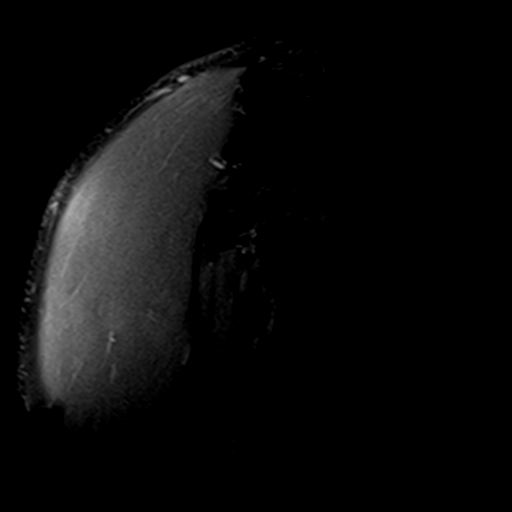
[im 3/15]
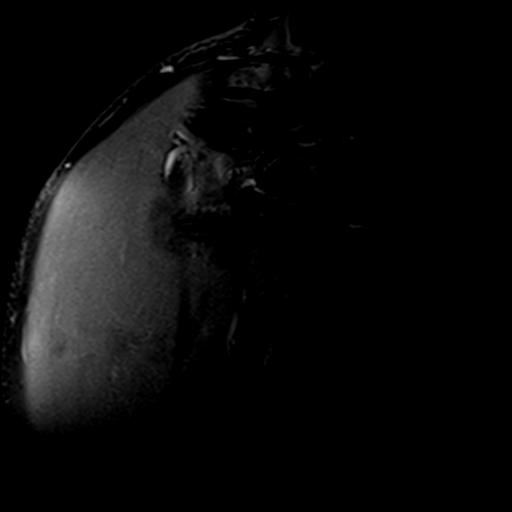
[im 6/15]
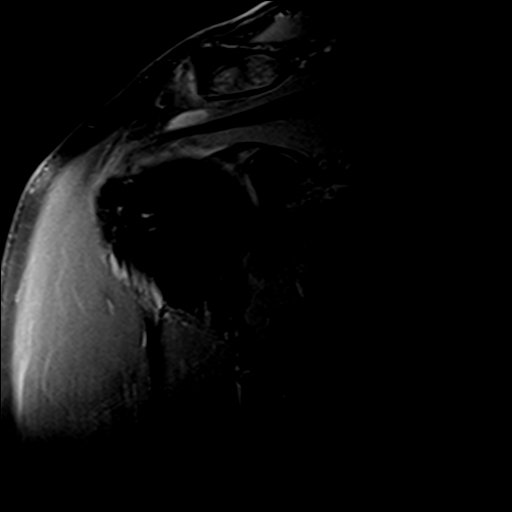
[im 9/15]
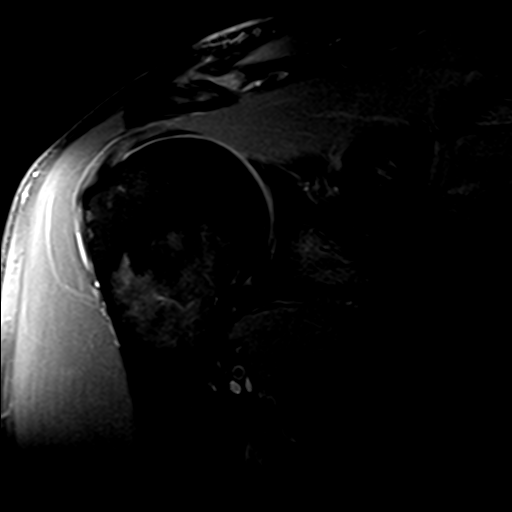
[im 12/15]
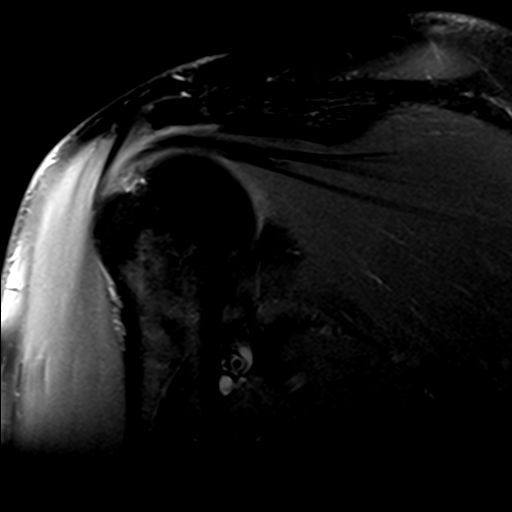
[im 15/15]
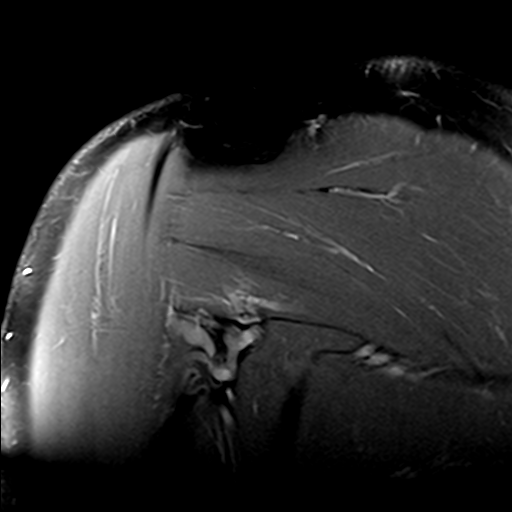

[Series 6: T2 fat-sat · oblique · 4.0mm · 0.55mm/px · 7 of 19 slices shown (2 of 3)]
[im 1/19]
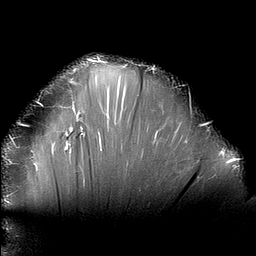
[im 4/19]
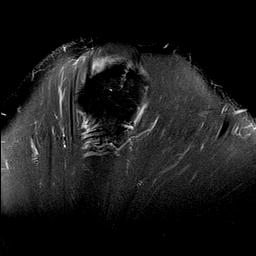
[im 7/19]
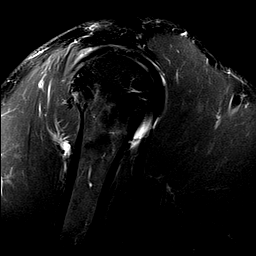
[im 10/19]
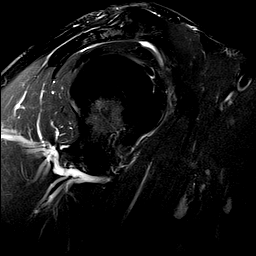
[im 13/19]
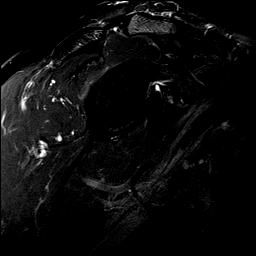
[im 16/19]
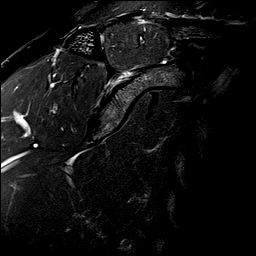
[im 19/19]
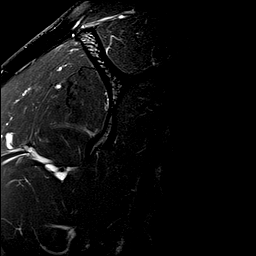

[Series 8: T2 fat-sat · sagittal · 4.0mm · 0.55mm/px · 4 of 15 slices shown (3 of 3)]
[im 1/15]
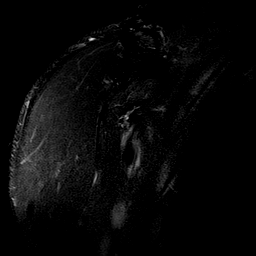
[im 3/15]
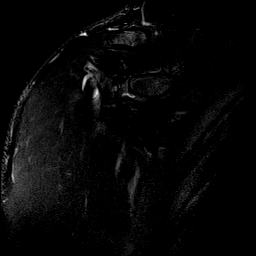
[im 6/15]
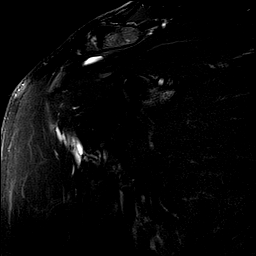
[im 9/15]
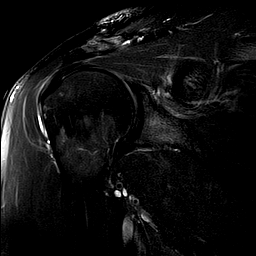

[31 of 40 positions shown; findings below may reference images not displayed]

FINDINGS: Rotator cuff: Mild supraspinatus and infraspinatus tendinosis with
small focal partial-thickness articular surface tears of the distal
tendons at the footprint. The subscapularis and teres minor tendons
are unremarkable.

Muscles: No atrophy or abnormal signal of the muscles of the rotator
cuff.

Biceps long head:  Intact and normally positioned.

Acromioclavicular Joint: Mild arthropathy of the acromioclavicular
joint. Type II acromion. Small amount of fluid in the
subacromial/subdeltoid bursa.

Glenohumeral Joint: No joint effusion. No chondral defect.

Labrum: Grossly intact, but evaluation is limited by lack of
intraarticular fluid.

Bones: Tiny reactive changes in the greater tuberosity. No acute
fracture or dislocation. No focal bone lesion.

Other: None.
IMPRESSION: 1. Mild supraspinatus and infraspinatus tendinosis with small focal
partial-thickness articular surface tears of the distal tendons at
the footprint.
2. Mild acromioclavicular osteoarthritis.
3. Mild subacromial/subdeltoid bursitis.

## 2019-02-20 ENCOUNTER — Ambulatory Visit: Payer: 59 | Admitting: Sports Medicine

## 2019-03-03 ENCOUNTER — Other Ambulatory Visit: Payer: Self-pay | Admitting: Family Medicine

## 2019-03-04 NOTE — Progress Notes (Addendum)
[name] [place], [place] [address] Phone: [phone] Subjective:   Fontaine No, am serving as a scribe for Dr. Hulan Saas.  I'm seeing this patient by the request  of:    CC: Shoulder pain follow-up  SNK:[id number]      Update 03/05/2019: Shane [name] is a 48 y.o. male coming in with complaint of right shoulder pain. Pain over the top and lateral aspect of joint. Has had an MRI 10/219. Has been using nitro patch for 8 weeks. Feels shoulder is slowly improving. Pain increases with flexion and external rotation. Patient has also been having pain in the forearm over the bracioradialis. Has been having 3/10 pain.  Patient states that it does not stop him from activity.  Still somewhat uncomfortable from time to time.  Patient wants to be to do so secondary to the discomfort and pain.  Patient continues the nitroglycerin with no side effects.  Has not been taking the erectile dysfunction medications.   Patient also complains of a new problem.  Right elbow pain.  Worse with doing repetitive typing.  Has noticed a dull, throbbing aching pain.  Patient denies any sharp pain.  Denies any weakness.  Does not remember an injury.  Past Medical History:  Diagnosis Date  . Factor 5 Leiden mutation, heterozygous ([place])   . Umbilical hernia    No past surgical history on file. Social History   Socioeconomic History  . Marital status: Married    Spouse name: Not on file  . Number of children: Not on file  . Years of education: Not on file  . Highest education level: Not on file  Occupational History  . Not on file  Social Needs  . Financial resource strain: Not on file  . Food insecurity:    Worry: Not on file    Inability: Not on file  . Transportation needs:    Medical: Not on file    Non-medical: Not on file  Tobacco Use  . Smoking status: Never Smoker  . Smokeless tobacco: Never Used  Substance and Sexual Activity  .  Alcohol use: Yes  . Drug use: No  . Sexual activity: Not on file  Lifestyle  . Physical activity:    Days per week: Not on file    Minutes per session: Not on file  . Stress: Not on file  Relationships  . Social connections:    Talks on phone: Not on file    Gets together: Not on file    Attends religious service: Not on file    Active member of club or organization: Not on file    Attends meetings of clubs or organizations: Not on file    Relationship status: Not on file  Other Topics Concern  . Not on file  Social History Narrative  . Not on file   Allergies  Allergen Reactions  . Zithromax [Azithromycin] Hives   Family History  Problem Relation Age of Onset  . Cancer Neg Hx     Current Outpatient Medications (Endocrine & Metabolic):  .  testosterone cypionate (DEPOTESTOSTERONE CYPIONATE) 200 MG/ML injection, INJECT 1.5 ML INTO THE MUSCLE EVERY 14 DAYS  Current Outpatient Medications (Cardiovascular):  .  cholestyramine (QUESTRAN) 4 GM/DOSE powder, Take 4 g by mouth Nightly.  .  nitroGLYCERIN (NITRODUR - DOSED IN MG/24 HR) 0.2 mg/hr patch, PLEASE SEE ATTACHED FOR DETAILED DIRECTIONS .  sildenafil (REVATIO) 20 MG tablet, Take 1-5 tablets (20-100 mg  total) by mouth daily as needed (erectile dysfunction).  Current Outpatient Medications (Respiratory):  .  levocetirizine (XYZAL) 5 MG tablet, Take 5 mg by mouth every evening.  Current Outpatient Medications (Analgesics):  .  diclofenac (VOLTAREN) 75 MG EC tablet, TAKE 1 TABLET BY MOUTH TWICE A DAY   Current Outpatient Medications (Other):  Marland Kitchen  Multiple Vitamin (MULTIVITAMIN) tablet, Take 1 tablet by mouth daily. Marland Kitchen  gabapentin (NEURONTIN) 100 MG capsule, Take 2 capsules (200 mg total) by mouth at bedtime.    Past medical history, social, surgical and family history all reviewed in electronic medical record.  No pertanent information unless stated regarding to the chief complaint.   Review of Systems:  No headache,  visual changes, nausea, vomiting, diarrhea, constipation, dizziness, abdominal pain, skin rash, fevers, chills, night sweats, weight loss, swollen lymph nodes, body aches, joint swelling, hest pain, shortness of breath, mood changes.  Very mild positive muscle aches  Objective  Blood pressure 122/82, pulse 74, height 6' (1.829 m), SpO2 96 %.    General: No apparent distress alert and oriented x3 mood and affect normal, dressed appropriately.  HEENT: Pupils equal, extraocular movements intact  Respiratory: Patient's speak in full sentences and does not appear short of breath  Cardiovascular: No lower extremity edema, non tender, no erythema  Skin: Warm dry intact with no signs of infection or rash on extremities or on axial skeleton.  Abdomen: Soft nontender  Neuro: Cranial nerves II through XII are intact, neurovascularly intact in all extremities with 2+ DTRs and 2+ pulses.  Lymph: No lymphadenopathy of posterior or anterior cervical chain or axillae bilaterally.  Gait normal with good balance and coordination.  MSK:  Non tender with full range of motion and good stability and symmetric strength and tone of , wrist, hip, knee and ankles bilaterally.  Shoulder: Right Inspection reveals no abnormalities, atrophy or asymmetry. Palpation is normal with no tenderness over AC joint or bicipital groove. ROM is full in all planes passively. Rotator cuff strength normal throughout. signs of impingement with positive Neer and Hawkin's tests, but negative empty can sign. Speeds and Yergason's tests normal. No labral pathology noted with negative Obrien's, negative clunk and good stability. Normal scapular function observed. No painful arc and no drop arm sign. No apprehension sign  Right elbow exam shows the patient is tender to palpation over the common extensor tendon.  Worsening pain with resisted extension of the wrist.  Good grip strength.  Neurovascular intact distally.  Negative Spurling's  of the neck.  Full range of motion of the elbow noted.  Contralateral elbow unremarkable  MSK US performed of: Right This study was ordered, performed, and interpreted by Charlann Boxer D.O.  Shoulder:   Supraspinatus:  Appears normal on long and transverse views, Bursal bulge seen with shoulder abduction on impingement view. Infraspinatus:  Appears normal on long and transverse views. Significant increase in Doppler flow Subscapularis:  Appears normal on long and transverse views. Positive bursa Teres Minor:  Appears normal on long and transverse views. AC joint:  Capsule undistended, no geyser sign. Glenohumeral Joint:  Appears normal without effusion. Glenoid Labrum:  Intact without visualized tears. Biceps Tendon:  Appears normal on long and transverse views, no fraying of tendon, tendon located in intertubercular groove, no subluxation with shoulder internal or external rotation.  Impression: Subacromial bursitis mild overall       Impression and Recommendations:     The above documentation has been reviewed and is accurate and complete Olevia Bowens  Tamala Julian, DO       Note: This dictation was prepared with Dragon dictation along with smaller phrase technology. Any transcriptional errors that result from this process are unintentional.

## 2019-03-05 ENCOUNTER — Other Ambulatory Visit: Payer: Self-pay

## 2019-03-05 ENCOUNTER — Ambulatory Visit: Payer: Self-pay

## 2019-03-05 ENCOUNTER — Ambulatory Visit (INDEPENDENT_AMBULATORY_CARE_PROVIDER_SITE_OTHER): Payer: 59 | Admitting: Family Medicine

## 2019-03-05 ENCOUNTER — Encounter: Payer: Self-pay | Admitting: Family Medicine

## 2019-03-05 VITALS — BP 122/82 | HR 74 | Ht 72.0 in

## 2019-03-05 DIAGNOSIS — G8929 Other chronic pain: Secondary | ICD-10-CM | POA: Diagnosis not present

## 2019-03-05 DIAGNOSIS — M25511 Pain in right shoulder: Secondary | ICD-10-CM

## 2019-03-05 DIAGNOSIS — M67911 Unspecified disorder of synovium and tendon, right shoulder: Secondary | ICD-10-CM | POA: Diagnosis not present

## 2019-03-05 DIAGNOSIS — M7711 Lateral epicondylitis, right elbow: Secondary | ICD-10-CM

## 2019-03-05 MED ORDER — GABAPENTIN 100 MG PO CAPS: 200.0000 mg | ORAL_CAPSULE | Freq: Every day | ORAL | 3 refills | Status: DC

## 2019-03-05 NOTE — Patient Instructions (Signed)
Good to see you  Ice 20 minutes 2 times daily. Usually after activity and before bed. Avoid wrist extension  Wear brace day and night for 1 week then nightly for 2 weeks.  No overhand lifting for some time Gabapentin 200mg  at night Compression sleeve daily to left elbow 1 week then after that anytime when putting pressure on it or working out.  YOu should do great overall  See me again in 6 weeks

## 2019-03-06 ENCOUNTER — Encounter: Payer: Self-pay | Admitting: Family Medicine

## 2019-03-06 DIAGNOSIS — M7711 Lateral epicondylitis, right elbow: Secondary | ICD-10-CM | POA: Insufficient documentation

## 2019-03-06 NOTE — Assessment & Plan Note (Signed)
Patient has had an MRI showing more of a tendinopathy.  Has been doing relatively well with today and did not show any significant pruritus only been more reactive bursitis.  Patient did not want any injection.  Discussed doing a PRP.  Patient was doing well with therapy and can follow-up again in 6 to 8 weeks.

## 2019-03-06 NOTE — Assessment & Plan Note (Signed)
Elbow anatomy was reviewed, and tendinopathy was explained.  Pt. given a home rehab program. Start with isometrics and ROM, then a series of concentric and eccentric exercises should be done starting with no weight, work up to 1 lb, hammer, etc.  Use counterforce strap if working or using hands.  Formal PT would be beneficial. Emphasized stretching an cross-friction massage Emphasized proper palms up lifting biomechanics to unload ECRB Patient was given the brace today

## 2019-03-27 ENCOUNTER — Other Ambulatory Visit: Payer: Self-pay | Admitting: Family Medicine

## 2019-04-16 ENCOUNTER — Ambulatory Visit: Payer: 59 | Admitting: Family Medicine

## 2019-04-23 ENCOUNTER — Encounter: Payer: Self-pay | Admitting: Family Medicine

## 2019-04-23 ENCOUNTER — Ambulatory Visit (INDEPENDENT_AMBULATORY_CARE_PROVIDER_SITE_OTHER): Payer: 59 | Admitting: Family Medicine

## 2019-04-23 ENCOUNTER — Other Ambulatory Visit: Payer: Self-pay

## 2019-04-23 DIAGNOSIS — M7711 Lateral epicondylitis, right elbow: Secondary | ICD-10-CM

## 2019-04-23 DIAGNOSIS — M778 Other enthesopathies, not elsewhere classified: Secondary | ICD-10-CM | POA: Insufficient documentation

## 2019-04-23 DIAGNOSIS — M67911 Unspecified disorder of synovium and tendon, right shoulder: Secondary | ICD-10-CM

## 2019-04-23 DIAGNOSIS — M779 Enthesopathy, unspecified: Secondary | ICD-10-CM

## 2019-04-23 MED ORDER — VITAMIN D (ERGOCALCIFEROL) 1.25 MG (50000 UNIT) PO CAPS: 50000.0000 [IU] | ORAL_CAPSULE | ORAL | 0 refills | Status: DC

## 2019-04-23 NOTE — Patient Instructions (Addendum)
Good to see you.  Compression sleeve with activity Once weekly vitamin D for 12 weeks.  1/2 Gatorade to 1 cup of water Increase essential amino acids Something little every 2 hours Nitro over wherever hurst the most See me again in 5-6 weeks we will consider labs

## 2019-04-23 NOTE — Assessment & Plan Note (Signed)
Right lateral epicondylitis.  No significant improvement at this time.  We discussed the nitroglycerin patch, icing regimen, we discussed avoiding wrist extension.  Discussed the possibility of injections which patient has declined.  We discussed laboratory work-up to further evaluate to see if any other reason why patient continues to have discomfort and pain.  Patient wants to continue conservative therapy at this time.  Follow-up again in 4 to 8 weeks

## 2019-04-23 NOTE — Assessment & Plan Note (Signed)
Tendinitis of the left tricep.  Discussed compression, topical anti-inflammatories and calcific changes.  Follow-up again in 4 to 8 weeks

## 2019-04-23 NOTE — Assessment & Plan Note (Signed)
Improvement noted.  Discussed with patient again great length, discussed with patient about home exercises on a greater basis.  Discussed even PRP if needed.  As long as patient does well continue with conservative therapy.

## 2019-04-23 NOTE — Progress Notes (Signed)
[name] [address] [place], [place] [address] Phone: [phone] Subjective:   I Shane [name] am serving as a Education administrator for Dr. Hulan Saas.   CC: Shoulder and elbow pain follow-up  JJO:[id number]   03/06/2019 Patient has had an MRI showing more of a tendinopathy.  Has been doing relatively well with today and did not show any significant pruritus only been more reactive bursitis.  Patient did not want any injection.  Discussed doing a PRP.  Patient was doing well with therapy and can follow-up again in 6 to 8 weeks.  Elbow anatomy was reviewed, and tendinopathy was explained.  Pt. given a home rehab program. Start with isometrics and ROM, then a series of concentric and eccentric exercises should be done starting with no weight, work up to 1 lb, hammer, etc.  Use counterforce strap if working or using hands.  Formal PT would be beneficial. Emphasized stretching an cross-friction massage Emphasized proper palms up lifting biomechanics to unload ECRB Patient was given the brace today  04/23/2019 Shane [name] is a 48 y.o. male coming in with complaint of shoulder and elbow pain. Shoulder is slowly getting better. Elbow feels about the same. Patient feels that the shoulder has made significant improvement at this time.  Feels that physical therapy has been very helpful.  Patient feels her range of motion is improving the pain is significantly decreased by 70 to 80%.  Continues to have elbow pain.  Seems to be bilateral.  More of a lateral epicondylitis on the right side.  Noticing more of a posterior discomfort to his elbow on the left side.  Patient rates both of the severity of the pain is 5 out of 10.  Patient continues to be very active.  Patient tries to workout on a daily basis as well as doing yard work.  Patient knows that he is not getting the proper nutrition.  Trying to slim him down for the summer months.     Past Medical  History:  Diagnosis Date  . Factor 5 Leiden mutation, heterozygous ([place])   . Umbilical hernia    No past surgical history on file. Social History   Socioeconomic History  . Marital status: Married    Spouse name: Not on file  . Number of children: Not on file  . Years of education: Not on file  . Highest education level: Not on file  Occupational History  . Not on file  Social Needs  . Financial resource strain: Not on file  . Food insecurity    Worry: Not on file    Inability: Not on file  . Transportation needs    Medical: Not on file    Non-medical: Not on file  Tobacco Use  . Smoking status: Never Smoker  . Smokeless tobacco: Never Used  Substance and Sexual Activity  . Alcohol use: Yes  . Drug use: No  . Sexual activity: Not on file  Lifestyle  . Physical activity    Days per week: Not on file    Minutes per session: Not on file  . Stress: Not on file  Relationships  . Social Herbalist on phone: Not on file    Gets together: Not on file    Attends religious service: Not on file    Active member of club or organization: Not on file    Attends meetings of clubs or organizations: Not on file    Relationship status:  Not on file  Other Topics Concern  . Not on file  Social History Narrative  . Not on file   Allergies  Allergen Reactions  . Zithromax [Azithromycin] Hives   Family History  Problem Relation Age of Onset  . Cancer Neg Hx     Current Outpatient Medications (Endocrine & Metabolic):  .  testosterone cypionate (DEPOTESTOSTERONE CYPIONATE) 200 MG/ML injection, INJECT 1.5 ML INTO THE MUSCLE EVERY 14 DAYS  Current Outpatient Medications (Cardiovascular):  .  cholestyramine (QUESTRAN) 4 GM/DOSE powder, Take 4 g by mouth Nightly.  .  nitroGLYCERIN (NITRODUR - DOSED IN MG/24 HR) 0.2 mg/hr patch, PLEASE SEE ATTACHED FOR DETAILED DIRECTIONS .  sildenafil (REVATIO) 20 MG tablet, Take 1-5 tablets (20-100 mg total) by mouth daily as needed  (erectile dysfunction).  Current Outpatient Medications (Respiratory):  .  levocetirizine (XYZAL) 5 MG tablet, Take 5 mg by mouth every evening.  Current Outpatient Medications (Analgesics):  .  diclofenac (VOLTAREN) 75 MG EC tablet, TAKE 1 TABLET BY MOUTH TWICE A DAY   Current Outpatient Medications (Other):  .  gabapentin (NEURONTIN) 100 MG capsule, TAKE 2 CAPSULES BY MOUTH AT BEDTIME. .  Multiple Vitamin (MULTIVITAMIN) tablet, Take 1 tablet by mouth daily. .  Vitamin D, Ergocalciferol, (DRISDOL) 1.25 MG (50000 UT) CAPS capsule, Take 1 capsule (50,000 Units total) by mouth every 7 (seven) days.    Past medical history, social, surgical and family history all reviewed in electronic medical record.  No pertanent information unless stated regarding to the chief complaint.   Review of Systems:  No headache, visual changes, nausea, vomiting, diarrhea, constipation, dizziness, abdominal pain, skin rash, fevers, chills, night sweats, weight loss, swollen lymph nodes, body aches, joint swelling,chest pain, shortness of breath, mood changes.  Positive muscle aches  Objective  Blood pressure 100/60, pulse 80, height 6' (1.829 m), weight 189 lb (85.7 kg), SpO2 97 %.   General: No apparent distress alert and oriented x3 mood and affect normal, dressed appropriately.  HEENT: Pupils equal, extraocular movements intact  Respiratory: Patient's speak in full sentences and does not appear short of breath  Cardiovascular: No lower extremity edema, non tender, no erythema  Skin: Warm dry intact with no signs of infection or rash on extremities or on axial skeleton.  Abdomen: Soft nontender  Neuro: Cranial nerves II through XII are intact, neurovascularly intact in all extremities with 2+ DTRs and 2+ pulses.  Lymph: No lymphadenopathy of posterior or anterior cervical chain or axillae bilaterally.  Gait normal with good balance and coordination.  MSK:  Non tender with full range of motion and good  stability and symmetric strength and tone o wrist, hip, knee and ankles bilaterally.  Right shoulder exam shows the patient does have full range of motion.  Mild positive impingement on the right side.  5 out of 5 strength of the rotator cuff.  Negative O'Brien's negative crossover test.  Contralateral shoulder unremarkable  Right elbow has full range of motion.  Tender to palpation over the lateral epicondylar region.  Patient does have very mild discomfort with resisted wrist extension on the right side.  Negative Tinel's sign at the elbow.  Full grip strength  Left elbow does have some tenderness to palpation over the elbow around the olecranon.  Pain with resisted extension of the elbow.  Tenderness mild over the tricep.    Impression and Recommendations:     This case required medical decision making of moderate complexity. The above documentation has been reviewed and is  accurate and complete Lyndal Pulley, DO       Note: This dictation was prepared with Dragon dictation along with smaller phrase technology. Any transcriptional errors that result from this process are unintentional.

## 2019-04-27 ENCOUNTER — Telehealth: Payer: Self-pay | Admitting: Family Medicine

## 2019-04-27 NOTE — Telephone Encounter (Signed)
Medication Refill - Medication: nitroGLYCERIN (NITRODUR - DOSED IN MG/24 HR) 0.2 mg/hr patch    Has the patient contacted their pharmacy? Yes.   (Agent: If no, request that the patient contact the pharmacy for the refill.) (Agent: If yes, when and what did the pharmacy advise?)  Preferred Pharmacy (with phone number or street name):  [id number] - [place], [place] [phone] (Phone) [phone] (Fax)     Agent: Please be advised that RX refills may take up to 3 business days. We ask that you follow-up with your pharmacy.

## 2019-04-27 NOTE — Telephone Encounter (Signed)
Needs to be refilled by sports med.  Algis Greenhouse. Jerline Pain, MD 04/27/2019 10:48 PM

## 2019-04-27 NOTE — Telephone Encounter (Signed)
Rx Reqest

## 2019-04-29 ENCOUNTER — Other Ambulatory Visit: Payer: Self-pay

## 2019-04-29 ENCOUNTER — Telehealth: Payer: Self-pay | Admitting: Family Medicine

## 2019-04-29 MED ORDER — NITROGLYCERIN 0.2 MG/HR TD PT24: MEDICATED_PATCH | TRANSDERMAL | 0 refills | Status: DC

## 2019-04-29 NOTE — Telephone Encounter (Signed)
Medication Refill - Medication: testosterone cypionate (DEPOTESTOSTERONE CYPIONATE) 200 MG/ML injection,   Has the patient contacted their pharmacy? Yes (Agent: If no, request that the patient contact the pharmacy for the refill.) (Agent: If yes, when and what did the pharmacy advise?)Contact PCP  Preferred Pharmacy (with phone number or street name):  [id number] - [place], [place] [phone] (Phone) [phone] (Fax)     Agent: Please be advised that RX refills may take up to 3 business days. We ask that you follow-up with your pharmacy.

## 2019-04-29 NOTE — Telephone Encounter (Signed)
Called in patches per a verbal from Dr. Tamala Julian.

## 2019-04-29 NOTE — Telephone Encounter (Signed)
Please advise.  Patient has seen Dr. Tamala Julian.

## 2019-04-30 NOTE — Telephone Encounter (Signed)
Please let patient that Dr. Jerline Pain will let him know about his refill when he returns to the office next week.   I defer to him. I do not see labwork. Dr. Jerline Pain can assess if he is due for anything or if ok for refill.  Thanks.

## 2019-04-30 NOTE — Telephone Encounter (Signed)
Please ask patient to schedule appointment with labs. We can give 1 month refill if he is out.  Algis Greenhouse. Jerline Pain, MD 04/30/2019 8:01 PM

## 2019-04-30 NOTE — Telephone Encounter (Signed)
Rx request 

## 2019-04-30 NOTE — Telephone Encounter (Signed)
Left detailed message regarding message below.Informed patient to call with any other questions.

## 2019-05-01 ENCOUNTER — Other Ambulatory Visit: Payer: Self-pay

## 2019-05-01 DIAGNOSIS — R7989 Other specified abnormal findings of blood chemistry: Secondary | ICD-10-CM

## 2019-05-01 MED ORDER — TESTOSTERONE CYPIONATE 200 MG/ML IM SOLN: INTRAMUSCULAR | 0 refills | Status: DC

## 2019-05-01 NOTE — Telephone Encounter (Signed)
Labs placed testosterone  ,Rx faxed

## 2019-05-06 ENCOUNTER — Other Ambulatory Visit (INDEPENDENT_AMBULATORY_CARE_PROVIDER_SITE_OTHER): Payer: 59

## 2019-05-06 ENCOUNTER — Other Ambulatory Visit: Payer: Self-pay

## 2019-05-06 DIAGNOSIS — R7989 Other specified abnormal findings of blood chemistry: Secondary | ICD-10-CM | POA: Diagnosis not present

## 2019-05-06 LAB — TESTOSTERONE: Testosterone: 434.03 ng/dL (ref 300.00–890.00)

## 2019-05-07 NOTE — Progress Notes (Signed)
Please inform patient of the following:  Testosterone level at goal. Recommend continuing current dose of testosterone.  Algis Greenhouse. Jerline Pain, MD 05/07/2019 4:53 PM

## 2019-05-28 ENCOUNTER — Ambulatory Visit: Payer: Self-pay

## 2019-05-28 ENCOUNTER — Other Ambulatory Visit: Payer: Self-pay

## 2019-05-28 ENCOUNTER — Encounter: Payer: Self-pay | Admitting: Family Medicine

## 2019-05-28 ENCOUNTER — Ambulatory Visit: Payer: 59 | Admitting: Family Medicine

## 2019-05-28 VITALS — BP 100/70 | HR 82 | Ht 72.0 in | Wt 189.0 lb

## 2019-05-28 DIAGNOSIS — M25521 Pain in right elbow: Secondary | ICD-10-CM

## 2019-05-28 DIAGNOSIS — M779 Enthesopathy, unspecified: Secondary | ICD-10-CM

## 2019-05-28 DIAGNOSIS — G5622 Lesion of ulnar nerve, left upper limb: Secondary | ICD-10-CM | POA: Insufficient documentation

## 2019-05-28 DIAGNOSIS — M778 Other enthesopathies, not elsewhere classified: Secondary | ICD-10-CM

## 2019-05-28 NOTE — Assessment & Plan Note (Signed)
Continues to have mild tenderness in the morning.  I do think there is, calcific changes only noted on the ultrasound today.  We discussed the nitroglycerin and his potential area as well.

## 2019-05-28 NOTE — Patient Instructions (Signed)
K2 200 mg daily Exercise 3 times a week Continue compression with exercise Heat 10 minutes deep massage ice 20 minutes 1 time daily New hip stretches 3 times a week See me again in 5-6 weeks

## 2019-05-28 NOTE — Progress Notes (Signed)
[name] [address] [place], [place] [address] Phone: [phone] Subjective:   I Shane [name] am serving as a Education administrator for Dr. Hulan Saas.  I'm seeing this patient by the request  of:    CC: elbow pain   [id number]   04/23/2019 Improvement noted.  Discussed with patient again great length, discussed with patient about home exercises on a greater basis.  Discussed even PRP if needed.  As long as patient does well continue with conservative therapy.  Shane [name] is a 48 y.o. male coming in with complaint of right elbow pain. States he feels about the same.  Patient states that overall seems to be having more pain with the left elbow.  Patient states is more around the tricep.  Describes the pain as a dull aching sensation.  Sometimes radiation to the left hand but very intermittent.  Continues to workout on a regular basis.      Past Medical History:  Diagnosis Date  . Factor 5 Leiden mutation, heterozygous ([place])   . Umbilical hernia    No past surgical history on file. Social History   Socioeconomic History  . Marital status: Married    Spouse name: Not on file  . Number of children: Not on file  . Years of education: Not on file  . Highest education level: Not on file  Occupational History  . Not on file  Social Needs  . Financial resource strain: Not on file  . Food insecurity    Worry: Not on file    Inability: Not on file  . Transportation needs    Medical: Not on file    Non-medical: Not on file  Tobacco Use  . Smoking status: Never Smoker  . Smokeless tobacco: Never Used  Substance and Sexual Activity  . Alcohol use: Yes  . Drug use: No  . Sexual activity: Not on file  Lifestyle  . Physical activity    Days per week: Not on file    Minutes per session: Not on file  . Stress: Not on file  Relationships  . Social Herbalist on phone: Not on file    Gets together: Not on file    Attends  religious service: Not on file    Active member of club or organization: Not on file    Attends meetings of clubs or organizations: Not on file    Relationship status: Not on file  Other Topics Concern  . Not on file  Social History Narrative  . Not on file   Allergies  Allergen Reactions  . Zithromax [Azithromycin] Hives   Family History  Problem Relation Age of Onset  . Cancer Neg Hx     Current Outpatient Medications (Endocrine & Metabolic):  .  testosterone cypionate (DEPOTESTOSTERONE CYPIONATE) 200 MG/ML injection, INJECT 1.5 ML INTO THE MUSCLE EVERY 14 DAYS  Current Outpatient Medications (Cardiovascular):  .  cholestyramine (QUESTRAN) 4 GM/DOSE powder, Take 4 g by mouth Nightly.  .  nitroGLYCERIN (NITRO-DUR) 0.2 mg/hr patch, Apply 1/4 of a patch to skin once daily. .  nitroGLYCERIN (NITRODUR - DOSED IN MG/24 HR) 0.2 mg/hr patch, PLEASE SEE ATTACHED FOR DETAILED DIRECTIONS .  sildenafil (REVATIO) 20 MG tablet, Take 1-5 tablets (20-100 mg total) by mouth daily as needed (erectile dysfunction).  Current Outpatient Medications (Respiratory):  .  levocetirizine (XYZAL) 5 MG tablet, Take 5 mg by mouth every evening.  Current Outpatient Medications (Analgesics):  .  diclofenac (VOLTAREN) 75 MG EC tablet, TAKE 1 TABLET BY MOUTH TWICE A DAY   Current Outpatient Medications (Other):  .  gabapentin (NEURONTIN) 100 MG capsule, TAKE 2 CAPSULES BY MOUTH AT BEDTIME. .  Multiple Vitamin (MULTIVITAMIN) tablet, Take 1 tablet by mouth daily. .  Vitamin D, Ergocalciferol, (DRISDOL) 1.25 MG (50000 UT) CAPS capsule, Take 1 capsule (50,000 Units total) by mouth every 7 (seven) days.    Past medical history, social, surgical and family history all reviewed in electronic medical record.  No pertanent information unless stated regarding to the chief complaint.   Review of Systems:  No headache, visual changes, nausea, vomiting, diarrhea, constipation, dizziness, abdominal pain, skin rash,  fevers, chills, night sweats, weight loss, swollen lymph nodes, body aches, joint swelling, muscle aches, chest pain, shortness of breath, mood changes.   Objective  Blood pressure 100/70, pulse 82, height 6' (1.829 m), weight 189 lb (85.7 kg), SpO2 98 %.   General: No apparent distress alert and oriented x3 mood and affect normal, dressed appropriately.  HEENT: Pupils equal, extraocular movements intact  Respiratory: Patient's speak in full sentences and does not appear short of breath  Cardiovascular: No lower extremity edema, non tender, no erythema  Skin: Warm dry intact with no signs of infection or rash on extremities or on axial skeleton.  Abdomen: Soft nontender  Neuro: Cranial nerves II through XII are intact, neurovascularly intact in all extremities with 2+ DTRs and 2+ pulses.  Lymph: No lymphadenopathy of posterior or anterior cervical chain or axillae bilaterally.  Gait normal with good balance and coordination.  MSK:  Non tender with full range of motion and good stability and symmetric strength and tone of shoulders, , wrist, hip, knee and ankles bilaterally.  Right elbow exam still has some mild discomfort over the lateral epicondylar region but overall seems to be improving.  Mild pain over the radial nerve itself.  Left tricep does have some tenderness to palpation at the insertion on the olecranon area.  Mild pain with internal rotation against resistance of the arm.  Limited musculoskeletal ultrasound was performed and interpreted by Lyndal Pulley  Limited ultrasound of patient's tricep does show that there is calcific changes at the insertion.  No true tear appreciated.  Hypoechoic changes around the ulnar nerve with what seems to be a chronic subluxation noted as well.    Impression and Recommendations:     This case required medical decision making of moderate complexity. The above documentation has been reviewed and is accurate and complete Lyndal Pulley, DO        Note: This dictation was prepared with Dragon dictation along with smaller phrase technology. Any transcriptional errors that result from this process are unintentional.

## 2019-05-28 NOTE — Assessment & Plan Note (Signed)
Flossing given, if no improvement consider injection

## 2019-05-29 ENCOUNTER — Encounter: Payer: Self-pay | Admitting: Family Medicine

## 2019-07-01 ENCOUNTER — Ambulatory Visit (INDEPENDENT_AMBULATORY_CARE_PROVIDER_SITE_OTHER): Payer: 59 | Admitting: Family Medicine

## 2019-07-01 ENCOUNTER — Other Ambulatory Visit: Payer: Self-pay | Admitting: Family Medicine

## 2019-07-01 ENCOUNTER — Other Ambulatory Visit: Payer: Self-pay

## 2019-07-01 ENCOUNTER — Ambulatory Visit: Payer: Self-pay

## 2019-07-01 VITALS — BP 122/88 | HR 69 | Ht 72.0 in

## 2019-07-01 DIAGNOSIS — M25522 Pain in left elbow: Secondary | ICD-10-CM | POA: Diagnosis not present

## 2019-07-01 DIAGNOSIS — M779 Enthesopathy, unspecified: Secondary | ICD-10-CM

## 2019-07-01 DIAGNOSIS — M778 Other enthesopathies, not elsewhere classified: Secondary | ICD-10-CM

## 2019-07-01 NOTE — Telephone Encounter (Signed)
Requested medication (s) are due for refill today: yes  Requested medication (s) are on the active medication list: yes    Future visit scheduled: yes  Notes to clinic:  Review for refill   Requested Prescriptions  Pending Prescriptions Disp Refills   cholestyramine (QUESTRAN) 4 GM/DOSE powder 378 g     Sig: Take 1 packet (4 g total) by mouth Nightly.     Cardiovascular:  Antilipid - Bile Acid Sequestrants Failed - 07/01/2019 10:51 AM      Failed - Total Cholesterol in normal range and within 360 days    No results found for: CHOL, POCCHOL       Failed - LDL in normal range and within 360 days    No results found for: LDLCALC, LDLC, HIRISKLDL       Failed - HDL in normal range and within 360 days    No results found for: HDL       Failed - Triglycerides in normal range and within 360 days    No results found for: TRIG       Passed - Valid encounter within last 12 months    Recent Outpatient Visits          1 month ago Right elbow pain   [place], [place]ary M, DO   2 months ago Right lateral epicondylitis   [place], [place], DO   3 months ago Chronic right shoulder pain   [place], [place], DO   6 months ago Tendinopathy of rotator cuff, right   [place], [place] D, DO   7 months ago Somatic dysfunction of thoracic region   [address] [place], Juanda Bond, DO      Future Appointments            Today Lyndal Pulley, [place], [facility]

## 2019-07-01 NOTE — Patient Instructions (Signed)
[place] Will call you with results

## 2019-07-01 NOTE — Assessment & Plan Note (Signed)
Patient has had pain of the left elbow for quite some time.  Seems to have a very mild tendinitis of the left elbow.  Patient's pain seems to be out of proportion and attempted to try to treat for more of an ulnar compression nerve as well.  At this time I am concerned for mild instability of the elbow.  Patient may have an injury of the ulnar collateral ligament that is unable to be seen on the ultrasound.  At this point I do feel an MR arthrogram would be beneficial.  Patient will have this ordered.  Depending on this patient would consider surgical intervention.  Patient is failed all conservative therapy over the last 6 months for this problem.

## 2019-07-01 NOTE — Telephone Encounter (Signed)
See note

## 2019-07-01 NOTE — Progress Notes (Signed)
[name] [address] [place], [place] [address] Phone: [phone] Subjective:   Shane [name], am serving as a scribe for Dr. Hulan Saas.   CC: Elbow pain follow-up  [id number]   05/28/2019 Continues to have mild tenderness in the morning.  I do think there is, calcific changes only noted on the ultrasound today.  We discussed the nitroglycerin and his potential area as well.  Update 07/01/2019 Shane [name] is a 48 y.o. male coming in with complaint of elbow pain. Is using nitro patches on both shoulder and elbow. Right shoulder has improved. Elbow has not improved.  Patient states that the left elbow just does not allow him to have full flexion.  Has been dealing with for 6 months.  Has been treated for more of an ulnar compression in the possibility of a tricep tendinitis.  [name] improvement with it at this time  Past Medical History:  Diagnosis Date   Factor 5 Leiden mutation, heterozygous ([place])    Umbilical hernia    [name] past surgical history on file. Social History   Socioeconomic History   Marital status: Married    Spouse name: Not on file   Number of children: Not on file   Years of education: Not on file   Highest education level: Not on file  Occupational History   Not on file  Social Needs   Financial resource strain: Not on file   Food insecurity    Worry: Not on file    Inability: Not on file   Transportation needs    Medical: Not on file    Non-medical: Not on file  Tobacco Use   Smoking status: Never Smoker   Smokeless tobacco: Never Used  Substance and Sexual Activity   Alcohol use: Yes   Drug use: [name]   Sexual activity: Not on file  Lifestyle   Physical activity    Days per week: Not on file    Minutes per session: Not on file   Stress: Not on file  Relationships   Social connections    Talks on phone: Not on file    Gets together: Not on file    Attends religious service: Not  on file    Active member of club or organization: Not on file    Attends meetings of clubs or organizations: Not on file    Relationship status: Not on file  Other Topics Concern   Not on file  Social History Narrative   Not on file   Allergies  Allergen Reactions   Zithromax [Azithromycin] Hives   Family History  Problem Relation Age of Onset   Cancer Neg Hx     Current Outpatient Medications (Endocrine & Metabolic):    testosterone cypionate (DEPOTESTOSTERONE CYPIONATE) 200 MG/ML injection, INJECT 1.5 ML INTO THE MUSCLE EVERY 14 DAYS  Current Outpatient Medications (Cardiovascular):    cholestyramine (QUESTRAN) 4 GM/DOSE powder, Take 4 g by mouth Nightly.    nitroGLYCERIN (NITRO-DUR) 0.2 mg/hr patch, Apply 1/4 of a patch to skin once daily.   nitroGLYCERIN (NITRODUR - DOSED IN MG/24 HR) 0.2 mg/hr patch, PLEASE SEE ATTACHED FOR DETAILED DIRECTIONS  Current Outpatient Medications (Respiratory):    levocetirizine (XYZAL) 5 MG tablet, Take 5 mg by mouth every evening.    Current Outpatient Medications (Other):    gabapentin (NEURONTIN) 100 MG capsule, TAKE 2 CAPSULES BY MOUTH AT BEDTIME.   Multiple Vitamin (MULTIVITAMIN) tablet, Take 1 tablet by mouth daily.  Vitamin D, Ergocalciferol, (DRISDOL) 1.25 MG (50000 UT) CAPS capsule, Take 1 capsule (50,000 Units total) by mouth every 7 (seven) days.    Past medical history, social, surgical and family history all reviewed in electronic medical record.  [name] pertanent information unless stated regarding to the chief complaint.   Review of Systems:  [name] headache, visual changes, nausea, vomiting, diarrhea, constipation, dizziness, abdominal pain, skin rash, fevers, chills, night sweats, weight loss, swollen lymph nodes, body aches, joint swelling,, chest pain, shortness of breath, mood changes.  Positive muscle aches  Objective  Blood pressure 122/88, pulse 69, height 6' (1.829 m), SpO2 97 %.    General: [name] apparent  distress alert and oriented x3 mood and affect normal, dressed appropriately.  HEENT: Pupils equal, extraocular movements intact  Respiratory: Patient's speak in full sentences and does not appear short of breath  Cardiovascular: [name] lower extremity edema, non tender, [name] erythema  Skin: Warm dry intact with [name] signs of infection or rash on extremities or on axial skeleton.  Abdomen: Soft nontender  Neuro: Cranial nerves II through XII are intact, neurovascularly intact in all extremities with 2+ DTRs and 2+ pulses.  Lymph: [name] lymphadenopathy of posterior or anterior cervical chain or axillae bilaterally.  Gait normal with good balance and coordination.  MSK:  tender with full range of motion and good stability and symmetric strength and tone of shoulders, wrist, hip, knee and ankles bilaterally.  Elbow exam shows the patient does have some mild shotgun deformity compared to contralateral side.  Tender over the medial epicondylar region and the ulnar nerve still noted with a positive Tinel's.  Patient does appear to have full flexion but describes the pain.  Pain more over the tricep in the posterior aspect of the medial epicondylar region  Limited musculoskeletal ultrasound was performed and interpreted by Lyndal Pulley  Limited ultrasound shows still some hypoechoic changes around the ulnar nerve.  Triceps tendinitis does have decreasing calcific changes that were seen previously.  Unable to fully evaluate the ulnar collateral ligament.   Impression and Recommendations:     This case required medical decision making of moderate complexity. The above documentation has been reviewed and is accurate and complete Lyndal Pulley, DO       Note: This dictation was prepared with Dragon dictation along with smaller phrase technology. Any transcriptional errors that result from this process are unintentional.

## 2019-07-01 NOTE — Telephone Encounter (Signed)
Pt request refill  cholestyramine (QUESTRAN) 4 GM/DOSE powder  Pt states Dr Jerline Pain has renewed this Rx but I do not see when.  [id number] [place], [place] [phone] (Phone) [phone] (Fax)

## 2019-07-02 ENCOUNTER — Encounter: Payer: Self-pay | Admitting: Family Medicine

## 2019-07-02 DIAGNOSIS — M25522 Pain in left elbow: Secondary | ICD-10-CM | POA: Insufficient documentation

## 2019-07-02 NOTE — Assessment & Plan Note (Signed)
Left elbow pain has failed conservative therapy being treated for ulnar compression, tricep tendinitis.  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  Patient will have an MR arthrogram done for further evaluation.  Patient would consider surgical intervention if needed.  Patient is in agreement with the plan

## 2019-07-07 ENCOUNTER — Telehealth: Payer: Self-pay

## 2019-07-07 NOTE — Telephone Encounter (Signed)
Called pt and scheduled him for a virtual visit on Friday October 2nd. Patient was asking if we could get any of this medication refilled for him just until his appt on Friday because he has been trying to get it refilled for a couple of weeks now. I told pt I would send a message on his behalf.

## 2019-07-07 NOTE — Telephone Encounter (Signed)
Copied from Rafael Capo [id number]. Topic: General - Other >> Jul 07, 2019  2:33 PM Shane [name] wrote: Reason for CRM: Patient wife called about refill on [name] medicationcholestyramine [name]) 4 GM/DOSE powder   that was requested [name] few days ago she states that she was in the office on 07/06/2019 and was told that the refill is pending but they have yet to hear anything and also say the Pharmacy is waiting on the doctor for the refill approval. Please call Ph#  [phone]

## 2019-07-07 NOTE — Telephone Encounter (Signed)
Please schedule patient for appointment.  Can be virtual if he wishes.  Patient has not been seen by Dr. Jerline Pain in over a year and this medication has never been prescribed by Dr. Jerline Pain.  Needs OV.  (And cannot refill without approval from Dr. Jerline Pain and he is out of the office until Thursday)

## 2019-07-08 ENCOUNTER — Other Ambulatory Visit: Payer: Self-pay | Admitting: *Deleted

## 2019-07-08 DIAGNOSIS — G5622 Lesion of ulnar nerve, left upper limb: Secondary | ICD-10-CM

## 2019-07-09 ENCOUNTER — Other Ambulatory Visit: Payer: Self-pay | Admitting: Family Medicine

## 2019-07-09 NOTE — Telephone Encounter (Signed)
Patient can wait until Friday to get a refill on his medication.

## 2019-07-10 ENCOUNTER — Ambulatory Visit (INDEPENDENT_AMBULATORY_CARE_PROVIDER_SITE_OTHER): Payer: 59 | Admitting: Family Medicine

## 2019-07-10 DIAGNOSIS — K529 Noninfective gastroenteritis and colitis, unspecified: Secondary | ICD-10-CM

## 2019-07-10 DIAGNOSIS — R7989 Other specified abnormal findings of blood chemistry: Secondary | ICD-10-CM | POA: Diagnosis not present

## 2019-07-10 MED ORDER — CHOLESTYRAMINE 4 GM/DOSE PO POWD: 4.0000 g | Freq: Every evening | ORAL | 3 refills | Status: DC

## 2019-07-10 NOTE — Assessment & Plan Note (Signed)
Stable.  Continue cholestyramine.  Refill sent in today.

## 2019-07-10 NOTE — Progress Notes (Signed)
    Chief Complaint:  Shane [name] is a 48 y.o. male who presents today for a virtual office visit with a chief complaint of Chronic Diarrhea.   Assessment/Plan:  Chronic diarrhea Stable.  Continue cholestyramine.  Refill sent in today.  Low testosterone Last testosterone level at goal.  Continue 300 mg IM every 14 days.     Subjective:  HPI:   His stable, chronic medical conditions are outlined below:  # Chronic Diarrhea - Uses cholestyramine weekly and tolerates well  # Low Testosterone -Takes 300 mg IM testosterone every 14 days.  Tolerating well without side effects  ROS: Per HPI  PMH: He reports that he has never smoked. He has never used smokeless tobacco. He reports current alcohol use. He reports that he does not use drugs.      Objective/Observations  Physical Exam: Gen: NAD, resting comfortably  Virtual Visit via Video   I connected with Shane [name] on 07/10/19 at 11:20 AM EDT by a video enabled telemedicine application and verified that I am speaking with the correct person using two identifiers. I discussed the limitations of evaluation and management by telemedicine and the availability of in person appointments. The patient expressed understanding and agreed to proceed.   Patient location: Home Provider location: [place] participating in the virtual visit: Myself and Patient     Algis Greenhouse. Jerline Pain, MD 07/10/2019 11:35 AM

## 2019-07-10 NOTE — Assessment & Plan Note (Signed)
Last testosterone level at goal.  Continue 300 mg IM every 14 days.

## 2019-07-16 ENCOUNTER — Ambulatory Visit
Admission: RE | Admit: 2019-07-16 | Discharge: 2019-07-16 | Disposition: A | Discharge status: Home / Self Care | Payer: 59 | Source: Ambulatory Visit | Attending: Family Medicine | Admitting: Family Medicine

## 2019-07-16 ENCOUNTER — Other Ambulatory Visit: Payer: Self-pay

## 2019-07-16 DIAGNOSIS — M25522 Pain in left elbow: Secondary | ICD-10-CM

## 2019-07-16 MED ORDER — IOPAMIDOL (ISOVUE-M 200) INJECTION 41%
5.0000 mL | Freq: Once | INTRAMUSCULAR | Status: AC
  Administered 2019-07-16: 5 mL via INTRA_ARTICULAR

## 2019-07-24 ENCOUNTER — Other Ambulatory Visit: Payer: Self-pay | Admitting: Family Medicine

## 2019-07-30 ENCOUNTER — Other Ambulatory Visit: Payer: Self-pay

## 2019-07-30 MED ORDER — TESTOSTERONE CYPIONATE 200 MG/ML IM SOLN: INTRAMUSCULAR | 0 refills | Status: DC

## 2019-07-30 NOTE — Progress Notes (Signed)
Rx sent in.  Algis Greenhouse. Jerline Pain, MD 07/30/2019 12:38 PM

## 2019-07-30 NOTE — Progress Notes (Signed)
Please advise 

## 2019-09-21 NOTE — Progress Notes (Signed)
[name] Shane [name], [place] [address] Phone: [phone] Subjective:   This visit occurred during the SARS-CoV-2 public health emergency.  Safety protocols were in place, including screening questions prior to the visit, additional usage of staff PPE, and extensive cleaning of exam room while observing appropriate contact time as indicated for disinfecting solutions.     CC: Right arm pain  [id number]   07/01/2019 Patient has had pain of the left elbow for quite some time.  Seems to have a very mild tendinitis of the left elbow.  Patient's pain seems to be out of proportion and attempted to try to treat for more of an ulnar compression nerve as well.  At this time I am concerned for mild instability of the elbow.  Patient may have an injury of the ulnar collateral ligament that is unable to be seen on the ultrasound.  At this point I do feel an MR arthrogram would be beneficial.  Patient will have this ordered.  Depending on this patient would consider surgical intervention.  Patient is failed all conservative therapy over the last 6 months for this problem.  Update 09/22/2019 Shane [name] is a 48 y.o. male coming in with complaint of right sided lat tightness and radicular symptoms. Tingling occurs with shoulder ER. Tingling in the 2nd and 3rd fingers. May have injured it doing yard work or at [employer].  Patient does not remember a very specific injury but has been doing some repetitive activity.  States that certain movements causes worsening pain.  Sometimes no rhyme or reason about it.     Past Medical History:  Diagnosis Date  . Factor 5 Leiden mutation, heterozygous ([place])   . Umbilical hernia    No past surgical history on file. Social History   Socioeconomic History  . Marital status: Married    Spouse name: Not on file  . Number of children: Not on file  . Years of education: Not on file  . Highest education level: Not  on file  Occupational History  . Not on file  Tobacco Use  . Smoking status: Never Smoker  . Smokeless tobacco: Never Used  Substance and Sexual Activity  . Alcohol use: Yes  . Drug use: No  . Sexual activity: Not on file  Other Topics Concern  . Not on file  Social History Narrative  . Not on file   Social Determinants of Health   Financial Resource Strain:   . Difficulty of Paying Living Expenses: Not on file  Food Insecurity:   . Worried About Charity fundraiser in the Last Year: Not on file  . Ran Out of Food in the Last Year: Not on file  Transportation Needs:   . Lack of Transportation (Medical): Not on file  . Lack of Transportation (Non-Medical): Not on file  Physical Activity:   . Days of Exercise per Week: Not on file  . Minutes of Exercise per Session: Not on file  Stress:   . Feeling of Stress : Not on file  Social Connections:   . Frequency of Communication with Friends and Family: Not on file  . Frequency of Social Gatherings with Friends and Family: Not on file  . Attends Religious Services: Not on file  . Active Member of Clubs or Organizations: Not on file  . Attends Archivist Meetings: Not on file  . Marital Status: Not on file   Allergies  Allergen Reactions  .  Zithromax [Azithromycin] Hives   Family History  Problem Relation Age of Onset  . Cancer Neg Hx     Current Outpatient Medications (Endocrine & Metabolic):  .  testosterone cypionate (DEPOTESTOSTERONE CYPIONATE) 200 MG/ML injection, INJECT 1.5 ML INTO THE MUSCLE EVERY 14 DAYS .  predniSONE (DELTASONE) 50 MG tablet, Take one tablet daily for the next 5 days.  Current Outpatient Medications (Cardiovascular):  .  cholestyramine (QUESTRAN) 4 GM/DOSE powder, Take 1 packet (4 g total) by mouth Nightly. .  nitroGLYCERIN (NITRODUR - DOSED IN MG/24 HR) 0.2 mg/hr patch, PLEASE SEE ATTACHED FOR DETAILED DIRECTIONS .  nitroGLYCERIN (NITRODUR - DOSED IN MG/24 HR) 0.2 mg/hr patch, APPLY  1/4 OF A PATCH TO SKIN ONCE DAILY. .  nitroGLYCERIN (NITRO-DUR) 0.2 mg/hr patch, Apply 1/4 of a patch to skin once daily.  Current Outpatient Medications (Respiratory):  .  levocetirizine (XYZAL) 5 MG tablet, Take 5 mg by mouth every evening.    Current Outpatient Medications (Other):  Marland Kitchen  Multiple Vitamin (MULTIVITAMIN) tablet, Take 1 tablet by mouth daily.    Past medical history, social, surgical and family history all reviewed in electronic medical record.  No pertanent information unless stated regarding to the chief complaint.   Review of Systems:  No headache, visual changes, nausea, vomiting, diarrhea, constipation, dizziness, abdominal pain, skin rash, fevers, chills, night sweats, weight loss, swollen lymph nodes, body aches, joint swelling, chest pain, shortness of breath, mood changes.  Positive muscle aches  Objective  Blood pressure 110/72, pulse 77, height 6' (1.829 m), weight 189 lb (85.7 kg), SpO2 97 %.   General: No apparent distress alert and oriented x3 mood and affect normal, dressed appropriately.  HEENT: Pupils equal, extraocular movements intact  Respiratory: Patient's speak in full sentences and does not appear short of breath  Cardiovascular: No lower extremity edema, non tender, no erythema  Skin: Warm dry intact with no signs of infection or rash on extremities or on axial skeleton.  Abdomen: Soft nontender  Neuro: Cranial nerves II through XII are intact, neurovascularly intact in all extremities with 2+ DTRs and 2+ pulses.  Lymph: No lymphadenopathy of posterior or anterior cervical chain or axillae bilaterally.  Gait normal with good balance and coordination.  MSK:  Non tender with full range of motion and good stability and symmetric strength and tone of shoulders, elbows, wrist, hip, knee and ankles bilaterally.  Neck exam has loss of lordosis.  Patient does have positive Spurling's with radicular symptoms in the C7 and C8 distribution.  4+ out of 5  strength in the C8 distribution compared to the contralateral side.  Patient has no weakness of the latissimus dorsi.  More of a tightness noted.  Full strength of the shoulder noted.  [id number]; 15 additional minutes spent for Therapeutic exercises as stated in above notes.  This included exercises focusing on stretching, strengthening, with significant focus on eccentric aspects.   Long term goals include an improvement in range of motion, strength, endurance as well as avoiding reinjury. Patient's frequency would include in 1-2 times a day, 3-5 times a week for a duration of 6-12 weeks. Exercises that included:  Basic scapular stabilization to include adduction and depression of scapula Scaption, focusing on proper movement and good control Internal and External rotation utilizing a theraband, with elbow tucked at side entire time  Proper technique shown and discussed handout in great detail with ATC.  All questions were discussed and answered.     Impression and Recommendations:  This case required medical decision making of moderate complexity. The above documentation has been reviewed and is accurate and complete Lyndal Pulley, DO       Note: This dictation was prepared with Dragon dictation along with smaller phrase technology. Any transcriptional errors that result from this process are unintentional.

## 2019-09-22 ENCOUNTER — Encounter: Payer: Self-pay | Admitting: Family Medicine

## 2019-09-22 ENCOUNTER — Ambulatory Visit (INDEPENDENT_AMBULATORY_CARE_PROVIDER_SITE_OTHER): Payer: 59 | Admitting: Family Medicine

## 2019-09-22 ENCOUNTER — Other Ambulatory Visit: Payer: Self-pay

## 2019-09-22 ENCOUNTER — Ambulatory Visit (INDEPENDENT_AMBULATORY_CARE_PROVIDER_SITE_OTHER)
Admission: RE | Admit: 2019-09-22 | Discharge: 2019-09-22 | Disposition: A | Discharge status: Home / Self Care | Payer: 59 | Source: Ambulatory Visit | Attending: Family Medicine | Admitting: Family Medicine

## 2019-09-22 VITALS — BP 110/72 | HR 77 | Ht 72.0 in | Wt 189.0 lb

## 2019-09-22 DIAGNOSIS — M5412 Radiculopathy, cervical region: Secondary | ICD-10-CM

## 2019-09-22 DIAGNOSIS — M5030 Other cervical disc degeneration, unspecified cervical region: Secondary | ICD-10-CM

## 2019-09-22 MED ORDER — NITROGLYCERIN 0.2 MG/HR TD PT24: MEDICATED_PATCH | TRANSDERMAL | 0 refills | Status: DC

## 2019-09-22 MED ORDER — PREDNISONE 50 MG PO TABS: ORAL_TABLET | ORAL | 0 refills | Status: DC

## 2019-09-22 NOTE — Patient Instructions (Addendum)
  [address] 1st floor [place], [place] [address] Phone [phone]  Prednisone 5 days Gabapentin 200mg  Xray downstairs See me again in 6 weeks

## 2019-09-22 NOTE — Assessment & Plan Note (Signed)
Concern for more for a cervical radiculopathy.  Discussed which activities to do which was to avoid.  Patient has been going to increase activity as tolerated.  Increase gabapentin more regularly, 5 days of prednisone, could be potentially more of an impingement in the scalenes region and will monitor.  Follow-up again in 4 to 8 weeks.

## 2019-09-25 ENCOUNTER — Other Ambulatory Visit: Payer: Self-pay | Admitting: Family Medicine

## 2019-10-05 ENCOUNTER — Other Ambulatory Visit: Payer: Self-pay | Admitting: Family Medicine

## 2019-10-05 NOTE — Telephone Encounter (Signed)
Pt called checking status. [facility]. Pt states it was not included in his bag [facility]

## 2019-10-27 ENCOUNTER — Ambulatory Visit: Payer: 59 | Admitting: Family Medicine

## 2019-12-12 ENCOUNTER — Other Ambulatory Visit: Payer: Self-pay | Admitting: Family Medicine

## 2019-12-27 ENCOUNTER — Other Ambulatory Visit: Payer: Self-pay | Admitting: Family Medicine

## 2020-01-20 ENCOUNTER — Other Ambulatory Visit: Payer: Self-pay | Admitting: Family Medicine

## 2020-01-21 NOTE — Telephone Encounter (Signed)
Rx request 

## 2020-04-28 ENCOUNTER — Other Ambulatory Visit: Payer: Self-pay | Admitting: Family Medicine

## 2020-05-09 ENCOUNTER — Other Ambulatory Visit: Payer: Self-pay

## 2020-05-09 ENCOUNTER — Telehealth: Payer: Self-pay | Admitting: *Deleted

## 2020-05-09 MED ORDER — NITROGLYCERIN 0.2 MG/HR TD PT24: MEDICATED_PATCH | TRANSDERMAL | 1 refills | Status: DC

## 2020-05-09 NOTE — Telephone Encounter (Signed)
Called in per verbal from Dr. Tamala Julian. Patient notified.

## 2020-05-09 NOTE — Telephone Encounter (Signed)
Pt called requesting a refill for nitroglycerin patches.

## 2020-05-31 ENCOUNTER — Other Ambulatory Visit: Payer: Self-pay | Admitting: Family Medicine

## 2020-06-24 ENCOUNTER — Other Ambulatory Visit: Payer: Self-pay | Admitting: Family Medicine

## 2020-07-12 ENCOUNTER — Telehealth: Payer: Self-pay

## 2020-07-12 MED ORDER — TESTOSTERONE CYPIONATE 200 MG/ML IM SOLN: 300.0000 mg | INTRAMUSCULAR | 2 refills | Status: DC

## 2020-07-12 NOTE — Telephone Encounter (Signed)
.. °  LAST APPOINTMENT DATE: 06/24/2020   NEXT APPOINTMENT DATE:@Visit  date not found  MEDICATION:testosterone cypionate (DEPOTESTOSTERONE CYPIONATE) 200 MG/ML injection    [id number] [place] [place] [address]  **Let patient know to contact pharmacy at the end of the day to make sure medication is ready. **  ** Please notify patient to allow 48-72 hours to process**  **Encourage patient to contact the pharmacy for refills or they can request refills through [facility]**  CLINICAL FILLS OUT ALL BELOW:   LAST REFILL:  QTY:  REFILL DATE:    OTHER COMMENTS:    Okay for refill?  Please advise

## 2020-07-12 NOTE — Telephone Encounter (Signed)
Pt requesting refills.

## 2020-08-15 ENCOUNTER — Telehealth: Payer: Self-pay | Admitting: Family Medicine

## 2020-08-15 ENCOUNTER — Other Ambulatory Visit: Payer: Self-pay

## 2020-08-15 MED ORDER — NITROGLYCERIN 0.2 MG/HR TD PT24: MEDICATED_PATCH | TRANSDERMAL | 0 refills | Status: AC

## 2020-08-15 NOTE — Telephone Encounter (Signed)
Patient called requesting a refill on nitroGLYCERIN (NITRODUR - DOSED IN MG/24 HR) 0.2 mg/hr [facility] [address]

## 2020-09-15 ENCOUNTER — Other Ambulatory Visit: Payer: Self-pay | Admitting: Family Medicine

## 2020-09-24 ENCOUNTER — Other Ambulatory Visit: Payer: Self-pay | Admitting: Family Medicine

## 2020-09-26 NOTE — Telephone Encounter (Signed)
Left message for patient to call back to schedule virtual visit for rx request.

## 2020-10-04 ENCOUNTER — Other Ambulatory Visit: Payer: Self-pay | Admitting: Family Medicine

## 2020-10-20 ENCOUNTER — Other Ambulatory Visit: Payer: Self-pay | Admitting: Family Medicine

## 2020-10-31 NOTE — Progress Notes (Unsigned)
Shane [name] Phone: [phone] Subjective:   Shane [name], am serving as a scribe for Shane [name]. This visit occurred during the SARS-CoV-2 public health emergency.  Safety protocols were in place, including screening questions prior to the visit, additional usage of staff PPE, and extensive cleaning of exam room while observing appropriate contact time as indicated for disinfecting solutions.   I'm seeing this patient by the request  of:  Shane Barrack, MD  CC: Neck pain, elbow pain follow-up and new onset foot pain  JSE:[id number]  Shane [name] is a 50 y.o. male coming in with complaint of neck and elbow pain. Seen in 2020 for C radiculopathy and ulnar nerve compression. Patient has been prescribed gabapentin and nitro patches. [name] longer uses gabapentin. Neck pain has improved.   Patient states that he has been using nitro patches for left plantar fasciitis. Stretches and using special ortho shoes.  Patient states minor improvement with the so far.  Patient will need a refill of the nitroglycerin.  Patient does notice his elbow pain but it is not limiting him.     Patient had MRI of the elbow done in October 2020 showing the patient did have a mild common extensor tendinosis otherwise unremarkable.  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  Past Medical History:  Diagnosis Date  . Factor 5 Leiden mutation, heterozygous ([place])   . Umbilical hernia    [name] past surgical history on file. Social History   Socioeconomic History  . Marital status: Married    Spouse name: Not on file  . Number of children: Not on file  . Years of education: Not on file  . Highest education level: Not on file  Occupational History  . Not on file  Tobacco Use  . Smoking status: Never Smoker  . Smokeless tobacco: Never Used  Substance and Sexual  Activity  . Alcohol use: Yes  . Drug use: [name]  . Sexual activity: Not on file  Other Topics Concern  . Not on file  Social History Narrative  . Not on file   Social Determinants of Health   Financial Resource Strain: Not on file  Food Insecurity: Not on file  Transportation Needs: Not on file  Physical Activity: Not on file  Stress: Not on file  Social Connections: Not on file   Allergies  Allergen Reactions  . Zithromax [Azithromycin] Hives   Family History  Problem Relation Age of Onset  . Cancer Neg Hx     Current Outpatient Medications (Endocrine & Metabolic):  .  predniSONE (DELTASONE) 50 MG tablet, Take one tablet daily for the next 5 days. Marland Kitchen  testosterone cypionate (DEPOTESTOSTERONE CYPIONATE) 200 MG/ML injection, INJECT 1.5 MLS (300 MG TOTAL) INTO THE MUSCLE EVERY 14 (FOURTEEN) DAYS.  Current Outpatient Medications (Cardiovascular):  .  cholestyramine (QUESTRAN) 4 g packet, USE 1 PACKET BY MOUTH NIGHTLY .  nitroGLYCERIN (NITRO-DUR) 0.1 mg/hr patch, 1/4 patch daily .  nitroGLYCERIN (NITRO-DUR) 0.2 mg/hr patch, Apply 1/4 of a patch to skin once daily. .  nitroGLYCERIN (NITRODUR - DOSED IN MG/24 HR) 0.2 mg/hr patch, APPLY 1/4 OF A PATCH TO SKIN ONCE DAILY.  Current Outpatient Medications (Respiratory):  .  levocetirizine (XYZAL) 5 MG tablet, Take 5 mg by mouth every evening.    Current Outpatient Medications (Other):  Marland Kitchen  Multiple Vitamin (MULTIVITAMIN) tablet, Take  1 tablet by mouth daily. .  Vitamin D, Ergocalciferol, (DRISDOL) 1.25 MG (50000 UT) CAPS capsule, TAKE 1 CAPSULE (50,000 UNITS TOTAL) BY MOUTH EVERY 7 (SEVEN) DAYS.   Reviewed prior external information including notes and imaging from  primary care provider As well as notes that were available from care everywhere and other healthcare systems.  Past medical history, social, surgical and family history all reviewed in electronic medical record.  [name] pertanent information unless stated regarding to the  chief complaint.   Review of Systems:  [name] headache, visual changes, nausea, vomiting, diarrhea, constipation, dizziness, abdominal pain, skin rash, fevers, chills, night sweats, weight loss, swollen lymph nodes, body aches, joint swelling, chest pain, shortness of breath, mood changes. POSITIVE muscle aches  Objective  Blood pressure 128/82, pulse 72, height 6' (1.829 m), weight 211 lb (95.7 kg), SpO2 96 %.   General: [name] apparent distress alert and oriented x3 mood and affect normal, dressed appropriately.  HEENT: Pupils equal, extraocular movements intact  Respiratory: Patient's speak in full sentences and does not appear short of breath  Cardiovascular: [name] lower extremity edema, non tender, [name] erythema  Gait normal with good balance and coordination.  MSK:  Non tender with full range of motion and good stability and symmetric strength and tone of shoulders, elbows, wrist, hip, knee and ankles bilaterally.  Patient was wearing good shoes at this time.  He was very comfortable.   Impression and Recommendations:     The above documentation has been reviewed and is accurate and complete Shane Pulley, DO

## 2020-11-01 ENCOUNTER — Ambulatory Visit (INDEPENDENT_AMBULATORY_CARE_PROVIDER_SITE_OTHER): Payer: 59 | Admitting: Family Medicine

## 2020-11-01 ENCOUNTER — Encounter: Payer: Self-pay | Admitting: Family Medicine

## 2020-11-01 ENCOUNTER — Other Ambulatory Visit: Payer: Self-pay

## 2020-11-01 DIAGNOSIS — M722 Plantar fascial fibromatosis: Secondary | ICD-10-CM | POA: Diagnosis not present

## 2020-11-01 MED ORDER — NITROGLYCERIN 0.1 MG/HR TD PT24: MEDICATED_PATCH | TRANSDERMAL | 12 refills | Status: DC

## 2020-11-01 NOTE — Patient Instructions (Signed)
Nitroglycerin Protocol   Apply 1/4 nitroglycerin patch to affected area daily.  Change position of patch within the affected area every 24 hours.  You may experience a headache during the first 1-2 weeks of using the patch, these should subside.  If you experience headaches after beginning nitroglycerin patch treatment, you may take your preferred over the counter pain reliever.  Another side effect of the nitroglycerin patch is skin irritation or rash related to patch adhesive.  Please notify our office if you develop more severe headaches or rash, and stop the patch.  Tendon healing with nitroglycerin patch may require 12 to 24 weeks depending on the extent of injury.  Men should not use if taking Viagra, Cialis, or Levitra.   Do not use if you have migraines or rosacea. Plantar fascia exercises See me again in 6-8 weeks

## 2020-11-01 NOTE — Assessment & Plan Note (Signed)
Patient has had some difficulty but is making some response.  We discussed over-the-counter orthotics that could be beneficial.  Given some home exercises.  Patient has responded well to nitroglycerin patches for other things and we have given him a new refill of this.  Patient knows of the side effects and did get them in writing again.  Patient continues to workout on a regular basis.  Patient can follow-up with me again in 6 to 8 weeks.

## 2020-11-15 ENCOUNTER — Other Ambulatory Visit: Payer: Self-pay | Admitting: Family Medicine

## 2020-11-18 ENCOUNTER — Other Ambulatory Visit: Payer: Self-pay

## 2020-11-18 MED ORDER — CHOLESTYRAMINE 4 G PO PACK: PACK | ORAL | 1 refills | Status: DC

## 2020-11-18 NOTE — Telephone Encounter (Signed)
..   LAST APPOINTMENT DATE: 11/15/2020   NEXT APPOINTMENT DATE:@Visit  date not found  MEDICATION:cholestyramine (QUESTRAN) 4 g packet    [facility]

## 2021-02-24 ENCOUNTER — Other Ambulatory Visit: Payer: Self-pay

## 2021-02-24 ENCOUNTER — Ambulatory Visit (INDEPENDENT_AMBULATORY_CARE_PROVIDER_SITE_OTHER): Payer: 59 | Admitting: Family Medicine

## 2021-02-24 ENCOUNTER — Encounter: Payer: Self-pay | Admitting: Family Medicine

## 2021-02-24 ENCOUNTER — Telehealth: Payer: Self-pay

## 2021-02-24 VITALS — BP 118/74 | HR 85 | Temp 98.2°F | Ht 72.0 in | Wt 196.4 lb

## 2021-02-24 DIAGNOSIS — Z13220 Encounter for screening for lipoid disorders: Secondary | ICD-10-CM

## 2021-02-24 DIAGNOSIS — Z0001 Encounter for general adult medical examination with abnormal findings: Secondary | ICD-10-CM | POA: Diagnosis not present

## 2021-02-24 DIAGNOSIS — K429 Umbilical hernia without obstruction or gangrene: Secondary | ICD-10-CM | POA: Diagnosis not present

## 2021-02-24 DIAGNOSIS — Z1211 Encounter for screening for malignant neoplasm of colon: Secondary | ICD-10-CM

## 2021-02-24 DIAGNOSIS — N529 Male erectile dysfunction, unspecified: Secondary | ICD-10-CM

## 2021-02-24 DIAGNOSIS — Z125 Encounter for screening for malignant neoplasm of prostate: Secondary | ICD-10-CM

## 2021-02-24 DIAGNOSIS — R7989 Other specified abnormal findings of blood chemistry: Secondary | ICD-10-CM

## 2021-02-24 LAB — TESTOSTERONE: Testosterone: 840 ng/dL (ref 300.00–890.00)

## 2021-02-24 LAB — COMPREHENSIVE METABOLIC PANEL
ALT: 20 U/L (ref 0–53)
AST: 30 U/L (ref 0–37)
Albumin: 4.5 g/dL (ref 3.5–5.2)
Alkaline Phosphatase: 41 U/L (ref 39–117)
BUN: 23 mg/dL (ref 6–23)
CO2: 29 mEq/L (ref 19–32)
Calcium: 9.6 mg/dL (ref 8.4–10.5)
Chloride: 104 mEq/L (ref 96–112)
Creatinine, Ser: 0.94 mg/dL (ref 0.40–1.50)
GFR: 93.91 mL/min (ref >60.00)
Glucose, Bld: 91 mg/dL (ref 70–99)
Potassium: 5.5 mEq/L — ABNORMAL HIGH (ref 3.5–5.1)
Sodium: 139 mEq/L (ref 135–145)
Total Bilirubin: 0.6 mg/dL (ref 0.2–1.2)
Total Protein: 6.8 g/dL (ref 6.0–8.3)

## 2021-02-24 LAB — PSA: PSA: 1.27 ng/mL (ref 0.10–4.00)

## 2021-02-24 LAB — CBC
HCT: 51.6 % (ref 39.0–52.0)
Hemoglobin: 17.8 g/dL — ABNORMAL HIGH (ref 13.0–17.0)
MCHC: 34.4 g/dL (ref 30.0–36.0)
MCV: 94.7 fl (ref 78.0–100.0)
Platelets: 274 10*3/uL (ref 150.0–400.0)
RBC: 5.45 Mil/uL (ref 4.22–5.81)
RDW: 13.2 % (ref 11.5–15.5)
WBC: 6.9 10*3/uL (ref 4.0–10.5)

## 2021-02-24 LAB — LIPID PANEL
Cholesterol: 167 mg/dL (ref 0–200)
HDL: 53.1 mg/dL (ref >39.00)
LDL Cholesterol: 104 mg/dL — ABNORMAL HIGH (ref 0–99)
NonHDL: 114.13
Total CHOL/HDL Ratio: 3
Triglycerides: 51 mg/dL (ref 0.0–149.0)
VLDL: 10.2 mg/dL (ref 0.0–40.0)

## 2021-02-24 LAB — TSH: TSH: 1.2 u[IU]/mL (ref 0.35–4.50)

## 2021-02-24 MED ORDER — SILDENAFIL CITRATE 20 MG PO TABS: 20.0000 mg | ORAL_TABLET | Freq: Every day | ORAL | 3 refills | Status: DC | PRN

## 2021-02-24 NOTE — Telephone Encounter (Signed)
See note

## 2021-02-24 NOTE — Telephone Encounter (Signed)
Pharmacy notified aware of the interaction. He uses a 1/4th of the patch for orthopedic conditions. He has no heart issues and he has done well with sildenafil in the past.

## 2021-02-24 NOTE — Telephone Encounter (Signed)
[facility] called and states that   sildenafil (REVATIO) 20 MG tablet  nitroGLYCERIN (NITRO-DUR) 0.2 mg/hr patch  Have an interaction and cannot be taken at the same time can we look into this and call her back and let her know   [phone]

## 2021-02-24 NOTE — Progress Notes (Signed)
Chief Complaint:  Shane [name] is a 50 y.o. male who presents today for his annual comprehensive physical exam.    Assessment/Plan:  Chronic Problems Addressed Today: Low testosterone He is on testosterone replacement 300 mg IM every 14 days.  We will check testosterone CBC and PSA today.  Erectile dysfunction Refilled sildenafil.  Umbilical hernia No red flags.  Will place referral to surgery for further management.  Preventative Healthcare: Check labs.  He will check with insurance regarding shingles vaccine.  We will order Cologuard today.  Patient Counseling(The following topics were reviewed and/or handout was given):  -Nutrition: Stressed importance of moderation in sodium/caffeine intake, saturated fat and cholesterol, caloric balance, sufficient intake of fresh fruits, vegetables, and fiber.  -Stressed the importance of regular exercise.   -Substance Abuse: Discussed cessation/primary prevention of tobacco, alcohol, or other drug use; driving or other dangerous activities under the influence; availability of treatment for abuse.   -Injury prevention: Discussed safety belts, safety helmets, smoke detector, smoking near bedding or upholstery.   -Sexuality: Discussed sexually transmitted diseases, partner selection, use of condoms, avoidance of unintended pregnancy and contraceptive alternatives.   -Dental health: Discussed importance of regular tooth brushing, flossing, and dental visits.  -Health maintenance and immunizations reviewed. Please refer to Health maintenance section.  Return to care in 1 year for next preventative visit.     Subjective:  HPI:  He has no acute complaints today.   Lifestyle Diet: Balanced.  Exercise: Works out regularly.   Depression screen PHQ 2/9 02/24/2021  Decreased Interest 0  Down, Depressed, Hopeless 0  PHQ - 2 Score 0    Health Maintenance Due  Topic Date Due  . HIV Screening  Never done  . Hepatitis C Screening   Never done  . Fecal DNA (Cologuard)  Never done     ROS: Per HPI, otherwise a complete review of systems was negative.   PMH:  The following were reviewed and entered/updated in epic: Past Medical History:  Diagnosis Date  . Factor 5 Leiden mutation, heterozygous ([place])   . Umbilical hernia    Patient Active Problem List   Diagnosis Date Noted  . Plantar fasciitis of left foot 11/01/2020  . Degenerative cervical disc 09/22/2019  . Left elbow pain 07/02/2019  . Ulnar nerve compression, left 05/28/2019  . Tendinitis of left triceps 04/23/2019  . Right lateral epicondylitis 03/06/2019  . Tendinopathy of rotator cuff, right 05/29/2018  . Erectile dysfunction 05/29/2018  . Allergic rhinitis 05/29/2018  . Low testosterone 05/29/2018  . Chronic diarrhea 05/29/2018  . Umbilical hernia    History reviewed. No pertinent surgical history.  Family History  Problem Relation Age of Onset  . Cancer Neg Hx     Medications- reviewed and updated Current Outpatient Medications  Medication Sig Dispense Refill  . cholestyramine (QUESTRAN) 4 g packet MIX TO USE 1 PACKET BY MOUTH NIGHTLY 60 packet 1  . levocetirizine (XYZAL) 5 MG tablet Take 5 mg by mouth every evening.    . Multiple Vitamin (MULTIVITAMIN) tablet Take 1 tablet by mouth daily.    . nitroGLYCERIN (NITRO-DUR) 0.1 mg/hr patch 1/4 patch daily 30 patch 12  . sildenafil (REVATIO) 20 MG tablet Take 1-5 tablets (20-100 mg total) by mouth daily as needed (erectile dysfunction). 90 tablet 3  . testosterone cypionate (DEPOTESTOSTERONE CYPIONATE) 200 MG/ML injection INJECT 1.5 MLS (300 MG TOTAL) INTO THE MUSCLE EVERY 14 (FOURTEEN) DAYS. 4 mL 2  . nitroGLYCERIN (NITRO-DUR) 0.2 mg/hr patch Apply 1/4 of  a patch to skin once daily. 30 patch 0   No current facility-administered medications for this visit.    Allergies-reviewed and updated Allergies  Allergen Reactions  . Zithromax [Azithromycin] Hives    Social History    Socioeconomic History  . Marital status: Married    Spouse name: Not on file  . Number of children: Not on file  . Years of education: Not on file  . Highest education level: Not on file  Occupational History  . Not on file  Tobacco Use  . Smoking status: Never Smoker  . Smokeless tobacco: Never Used  Substance and Sexual Activity  . Alcohol use: Yes  . Drug use: No  . Sexual activity: Not on file  Other Topics Concern  . Not on file  Social History Narrative  . Not on file   Social Determinants of Health   Financial Resource Strain: Not on file  Food Insecurity: Not on file  Transportation Needs: Not on file  Physical Activity: Not on file  Stress: Not on file  Social Connections: Not on file        Objective:  Physical Exam: BP 118/74   Pulse 85   Temp 98.2 F (36.8 C)   Ht 6' (1.829 m)   Wt 196 lb 6.1 oz (89.1 kg)   SpO2 97%   BMI 26.63 kg/m   Body mass index is 26.63 kg/m. Wt Readings from Last 3 Encounters:  02/24/21 196 lb 6.1 oz (89.1 kg)  11/01/20 211 lb (95.7 kg)  09/22/19 189 lb (85.7 kg)   Gen: NAD, resting comfortably HEENT: TMs normal bilaterally. OP clear. No thyromegaly noted.  CV: RRR with no murmurs appreciated Pulm: NWOB, CTAB with no crackles, wheezes, or rhonchi GI: Normal bowel sounds present. Soft, Nontender, Nondistended.  Reducible umbilical hernia noted. MSK: no edema, cyanosis, or clubbing noted Skin: warm, dry Neuro: CN2-12 grossly intact. Strength 5/5 in upper and lower extremities. Reflexes symmetric and intact bilaterally.  Psych: Normal affect and thought content     [name] M. Jerline Pain, MD 02/24/2021 9:10 AM

## 2021-02-24 NOTE — Assessment & Plan Note (Signed)
He is on testosterone replacement 300 mg IM every 14 days.  We will check testosterone CBC and PSA today.

## 2021-02-24 NOTE — Assessment & Plan Note (Signed)
Refilled sildenafil

## 2021-02-24 NOTE — Assessment & Plan Note (Signed)
No red flags.  Will place referral to surgery for further management. 

## 2021-02-24 NOTE — Telephone Encounter (Signed)
I am aware of the interaction. He uses a 1/4th of the patch for orthopedic conditions. He has no heart issues and he has done well with sildenafil in the past.  Algis Greenhouse. Jerline Pain, MD 02/24/2021 12:12 PM

## 2021-02-24 NOTE — Patient Instructions (Addendum)
It was very nice to see you today!  I will refill your medication.  We will refer you to see a surgeon for your hernia.  We will check blood work today and order Cologuard.  I will see you back in year for your next physical.  Please come back to see me sooner if needed.  Take care, RIH.  Ago we did review the cologuard for MRI so Dr Jerline Pain  PLEASE NOTE:  If you had any lab tests please let us know if you have not heard back within a few days. You may see your results on mychart before we have a chance to review them but we will give you a call once they are reviewed by [place]. If we ordered any referrals today, please let us know if you have not heard from their office within the next week.   Please try these tips to maintain a healthy lifestyle:   Eat at least 3 REAL meals and 1-2 snacks per day.  Aim for no more than 5 hours between eating.  If you eat breakfast, please do so within one hour of getting up.    Each meal should contain half fruits/vegetables, one quarter protein, and one quarter carbs (no bigger than a computer mouse)   Cut down on sweet beverages. This includes juice, soda, and sweet tea.     Drink at least 1 glass of water with each meal and aim for at least 8 glasses per day   Exercise at least 150 minutes every week.    Preventive Care 75-52 Years Old, Male Preventive care refers to lifestyle choices and visits with your health care provider that can promote health and wellness. This includes:  A yearly physical exam. This is also called an annual wellness visit.  Regular dental and eye exams.  Immunizations.  Screening for certain conditions.  Healthy lifestyle choices, such as: ? Eating a healthy diet. ? Getting regular exercise. ? Not using drugs or products that contain nicotine and tobacco. ? Limiting alcohol use. What can I expect for my preventive care visit? Physical exam Your health care provider will check your:  Height and weight.  These may be used to calculate your BMI (body mass index). BMI is a measurement that tells if you are at a healthy weight.  Heart rate and blood pressure.  Body temperature.  Skin for abnormal spots. Counseling Your health care provider may ask you questions about your:  Past medical problems.  Family's medical history.  Alcohol, tobacco, and drug use.  Emotional well-being.  Home life and relationship well-being.  Sexual activity.  Diet, exercise, and sleep habits.  Work and work Statistician.  Access to firearms. What immunizations do I need? Vaccines are usually given at various ages, according to a schedule. Your health care provider will recommend vaccines for you based on your age, medical history, and lifestyle or other factors, such as travel or where you work.   What tests do I need? Blood tests  Lipid and cholesterol levels. These may be checked every 5 years, or more often if you are over 62 years old.  Hepatitis C test.  Hepatitis B test. Screening  Lung cancer screening. You may have this screening every year starting at age 52 if you have a 30-pack-year history of smoking and currently smoke or have quit within the past 15 years.  Prostate cancer screening. Recommendations will vary depending on your family history and other risks.  Genital exam to check  for testicular cancer or hernias.  Colorectal cancer screening. ? All adults should have this screening starting at age 52 and continuing until age 52. ? Your health care provider may recommend screening at age 22 if you are at increased risk. ? You will have tests every 1-10 years, depending on your results and the type of screening test.  Diabetes screening. ? This is done by checking your blood sugar (glucose) after you have not eaten for a while (fasting). ? You may have this done every 1-3 years.  STD (sexually transmitted disease) testing, if you are at risk. Follow these instructions at  home: Eating and drinking  Eat a diet that includes fresh fruits and vegetables, whole grains, lean protein, and low-fat dairy products.  Take vitamin and mineral supplements as recommended by your health care provider.  Do not drink alcohol if your health care provider tells you not to drink.  If you drink alcohol: ? Limit how much you have to 0-2 drinks a day. ? Be aware of how much alcohol is in your drink. In the [place] one drink equals one 12 oz bottle of beer (355 mL), one 5 oz glass of wine (148 mL), or one 1 oz glass of hard liquor (44 mL).   Lifestyle  Take daily care of your teeth and gums. Brush your teeth every morning and night with fluoride toothpaste. Floss one time each day.  Stay active. Exercise for at least 30 minutes 5 or more days each week.  Do not use any products that contain nicotine or tobacco, such as cigarettes, e-cigarettes, and chewing tobacco. If you need help quitting, ask your health care provider.  Do not use drugs.  If you are sexually active, practice safe sex. Use a condom or other form of protection to prevent STIs (sexually transmitted infections).  If told by your health care provider, take low-dose aspirin daily starting at age 70.  Find healthy ways to cope with stress, such as: ? Meditation, yoga, or listening to music. ? Journaling. ? Talking to a trusted person. ? Spending time with friends and family. Safety  Always wear your seat belt while driving or riding in a vehicle.  Do not drive: ? If you have been drinking alcohol. Do not ride with someone who has been drinking. ? When you are tired or distracted. ? While texting.  Wear a helmet and other protective equipment during sports activities.  If you have firearms in your house, make sure you follow all gun safety procedures. What's next?  Go to your health care provider once a year for an annual wellness visit.  Ask your health care provider how often you should have your  eyes and teeth checked.  Stay up to date on all vaccines. This information is not intended to replace advice given to you by your health care provider. Make sure you discuss any questions you have with your health care provider. Document Revised: 06/23/2019 Document Reviewed: 09/18/2018 Elsevier Patient Education  2021 [employer].

## 2021-02-27 NOTE — Progress Notes (Signed)
Please inform patient of the following:  His cholesterol is borderline elevated but ok. Do not need to start meds but he should continue to work on diet and exercise and we can recheck in a year or so.  His potassium and hemoglobin are both slightly elevated. Would like for him to come back in 1-2 weeks to recheck. Please place future order for CBC and CMET.  Shane [name]. Jerline Pain, MD 02/27/2021 3:34 PM

## 2021-02-27 NOTE — Telephone Encounter (Signed)
Please advise 

## 2021-02-28 ENCOUNTER — Other Ambulatory Visit: Payer: Self-pay | Admitting: *Deleted

## 2021-02-28 DIAGNOSIS — D582 Other hemoglobinopathies: Secondary | ICD-10-CM

## 2021-02-28 NOTE — Telephone Encounter (Signed)
See results note. 

## 2021-03-17 LAB — COLOGUARD: Cologuard: NEGATIVE

## 2021-03-23 ENCOUNTER — Other Ambulatory Visit: Payer: Self-pay | Admitting: Family Medicine

## 2021-03-24 ENCOUNTER — Other Ambulatory Visit: Payer: Self-pay

## 2021-03-24 ENCOUNTER — Other Ambulatory Visit (INDEPENDENT_AMBULATORY_CARE_PROVIDER_SITE_OTHER): Payer: 59

## 2021-03-24 DIAGNOSIS — D582 Other hemoglobinopathies: Secondary | ICD-10-CM

## 2021-03-24 LAB — COMPREHENSIVE METABOLIC PANEL
ALT: 18 U/L (ref 0–53)
AST: 27 U/L (ref 0–37)
Albumin: 4.4 g/dL (ref 3.5–5.2)
Alkaline Phosphatase: 34 U/L — ABNORMAL LOW (ref 39–117)
BUN: 18 mg/dL (ref 6–23)
CO2: 29 mEq/L (ref 19–32)
Calcium: 9.3 mg/dL (ref 8.4–10.5)
Chloride: 102 mEq/L (ref 96–112)
Creatinine, Ser: 1.03 mg/dL (ref 0.40–1.50)
GFR: 84.11 mL/min (ref >60.00)
Glucose, Bld: 85 mg/dL (ref 70–99)
Potassium: 4.1 mEq/L (ref 3.5–5.1)
Sodium: 138 mEq/L (ref 135–145)
Total Bilirubin: 1.1 mg/dL (ref 0.2–1.2)
Total Protein: 7 g/dL (ref 6.0–8.3)

## 2021-03-24 LAB — CBC
HCT: 49.6 % (ref 39.0–52.0)
Hemoglobin: 17.1 g/dL — ABNORMAL HIGH (ref 13.0–17.0)
MCHC: 34.6 g/dL (ref 30.0–36.0)
MCV: 94.4 fl (ref 78.0–100.0)
Platelets: 271 10*3/uL (ref 150.0–400.0)
RBC: 5.25 Mil/uL (ref 4.22–5.81)
RDW: 13 % (ref 11.5–15.5)
WBC: 5.9 10*3/uL (ref 4.0–10.5)

## 2021-03-27 ENCOUNTER — Encounter: Payer: Self-pay | Admitting: Family Medicine

## 2021-03-28 NOTE — Progress Notes (Signed)
Please inform patient of the following:  Labs back to normal. We can recheck in a year.  Algis Greenhouse. Jerline Pain, MD 03/28/2021 5:17 PM

## 2021-04-04 NOTE — Progress Notes (Signed)
Please inform patient of the following:  Cologuard test was negative.  Algis Greenhouse. Jerline Pain, MD 04/04/2021 2:20 PM

## 2021-04-30 ENCOUNTER — Other Ambulatory Visit: Payer: Self-pay | Admitting: Family Medicine

## 2021-05-16 ENCOUNTER — Other Ambulatory Visit: Payer: Self-pay | Admitting: Family Medicine

## 2021-05-30 ENCOUNTER — Ambulatory Visit (INDEPENDENT_AMBULATORY_CARE_PROVIDER_SITE_OTHER): Payer: 59

## 2021-05-30 ENCOUNTER — Other Ambulatory Visit: Payer: Self-pay

## 2021-05-30 ENCOUNTER — Ambulatory Visit: Payer: Self-pay

## 2021-05-30 ENCOUNTER — Ambulatory Visit: Payer: 59 | Admitting: Family Medicine

## 2021-05-30 VITALS — BP 110/80 | HR 71 | Ht 72.0 in | Wt 195.0 lb

## 2021-05-30 DIAGNOSIS — M222X2 Patellofemoral disorders, left knee: Secondary | ICD-10-CM | POA: Diagnosis not present

## 2021-05-30 DIAGNOSIS — M25562 Pain in left knee: Secondary | ICD-10-CM

## 2021-05-30 NOTE — Assessment & Plan Note (Signed)
Patellofemoral syndrome.  Patient does have maybe some mild arthritic changes.  Awaiting x-rays.  Triple leg given.  Discussed VMO strengthening.  Patient is very active and encouraged him to continue to do so.  Follow-up with me again in 6 weeks.  Worsening pain consider formal physical therapy and injections.

## 2021-05-30 NOTE — Patient Instructions (Addendum)
Good to see you  X ray on your way out  Patella femoral exercises given Tru pul lite brace Left given  Voltaren gel OTC  Ice after activity Avoid that stretch you showed me. See me again in 6 weeks

## 2021-05-30 NOTE — Progress Notes (Signed)
[name] [place] Phone: [phone] Subjective:   Shane [name], am serving as a scribe for Dr. Hulan Saas.  I'm seeing this patient by the request  of:  Vivi Barrack, MD  CC: left knee pain   [id number]  Shane [name] is a 50 y.o. male coming in with complaint of L knee pain. Seen earlier this year for plantar fasciitis. Patient states with in the last year whenever he runs or with long walks his knee will get inflamed and be painful. Locates pain to anterior knee pain, describes pain as swelling and aching. Ice, rest and low impact exercising to help decrease the pain.       Past Medical History:  Diagnosis Date   Factor 5 Leiden mutation, heterozygous ([place])    Umbilical hernia    No past surgical history on file. Social History   Socioeconomic History   Marital status: Married    Spouse name: Not on file   Number of children: Not on file   Years of education: Not on file   Highest education level: Not on file  Occupational History   Not on file  Tobacco Use   Smoking status: Never   Smokeless tobacco: Never  Substance and Sexual Activity   Alcohol use: Yes   Drug use: No   Sexual activity: Not on file  Other Topics Concern   Not on file  Social History Narrative   Not on file   Social Determinants of Health   Financial Resource Strain: Not on file  Food Insecurity: Not on file  Transportation Needs: Not on file  Physical Activity: Not on file  Stress: Not on file  Social Connections: Not on file   Allergies  Allergen Reactions   Zithromax [Azithromycin] Hives   Family History  Problem Relation Age of Onset   Cancer Neg Hx     Current Outpatient Medications (Endocrine & Metabolic):    testosterone cypionate (DEPOTESTOSTERONE CYPIONATE) 200 MG/ML injection, INJECT 1.5 MLS (300 MG TOTAL) INTO THE MUSCLE EVERY 14 (FOURTEEN) DAYS.  Current Outpatient Medications  (Cardiovascular):    cholestyramine (QUESTRAN) 4 g packet, MIX 1 PACKET AND TAKE BY MOUTH NIGHTLY   nitroGLYCERIN (NITRO-DUR) 0.1 mg/hr patch, 1/4 patch daily   nitroGLYCERIN (NITRO-DUR) 0.2 mg/hr patch, Apply 1/4 of a patch to skin once daily.   sildenafil (REVATIO) 20 MG tablet, Take 1-5 tablets (20-100 mg total) by mouth daily as needed (erectile dysfunction).  Current Outpatient Medications (Respiratory):    levocetirizine (XYZAL) 5 MG tablet, Take 5 mg by mouth every evening.    Current Outpatient Medications (Other):    Multiple Vitamin (MULTIVITAMIN) tablet, Take 1 tablet by mouth daily.   Reviewed prior external information including notes and imaging from  primary care provider As well as notes that were available from care everywhere and other healthcare systems.  Past medical history, social, surgical and family history all reviewed in electronic medical record.  No pertanent information unless stated regarding to the chief complaint.   Review of Systems:  No headache, visual changes, nausea, vomiting, diarrhea, constipation, dizziness, abdominal pain, skin rash, fevers, chills, night sweats, weight loss, swollen lymph nodes, body aches, joint swelling, chest pain, shortness of breath, mood changes. POSITIVE muscle aches  Objective  Blood pressure 110/80, pulse 71, height 6' (1.829 m), weight 195 lb (88.5 kg), SpO2 98 %.   General: No apparent distress alert and oriented x3 mood and affect  normal, dressed appropriately.  HEENT: Pupils equal, extraocular movements intact  Respiratory: Patient's speak in full sentences and does not appear short of breath  Cardiovascular: No lower extremity edema, non tender, no erythema  Gait normal with good balance and coordination.  MSK:   Left knee exam shows mild lateral tracking of the patella noted.  No tenderness over the medial joint line.  Negative McMurray's.   Limited muscular skeletal ultrasound was performed and interpreted  by Hulan Saas, M  Limited musculoskeletal ultrasound shows the patient does have some mild narrowing of the patellofemoral joint.  Trace effusion noted.  Meniscus appear to be unremarkable.  No significant narrowing of the medial or lateral joint line.  LCL and MCL appears to be intact. Impression: Patellofemoral syndrome.   Impression and Recommendations:     The above documentation has been reviewed and is accurate and complete Shane Pulley, DO

## 2021-05-31 ENCOUNTER — Encounter: Payer: Self-pay | Admitting: Family Medicine

## 2021-06-01 ENCOUNTER — Other Ambulatory Visit: Payer: Self-pay | Admitting: Family Medicine

## 2021-07-02 ENCOUNTER — Other Ambulatory Visit: Payer: Self-pay | Admitting: Family Medicine

## 2021-07-06 NOTE — Progress Notes (Signed)
[facility] [place] [address] [place] Phone: [phone] Subjective:   IVilma Meckel, am serving as a scribe for Dr. Hulan Saas. This visit occurred during the SARS-CoV-2 public health emergency.  Safety protocols were in place, including screening questions prior to the visit, additional usage of staff PPE, and extensive cleaning of exam room while observing appropriate contact time as indicated for disinfecting solutions.   I'm seeing this patient by the request  of:  Vivi Barrack, MD  CC: Left knee pain  [id number]  05/30/2021 Patellofemoral syndrome.  Patient does have maybe some mild arthritic changes.  Awaiting x-rays.  Triple leg given.  Discussed VMO strengthening.  Patient is very active and encouraged him to continue to do so.  Follow-up with me again in 6 weeks.  Worsening pain consider formal physical therapy and injections.  Updated 07/11/2021 Jajuan Skoog is a 50 y.o. male coming in with complaint of left knee pain. The pain is unchanged, but not worse.  Patient has noticed that some of it seems to be more on the medial aspect of the knee now.  Has been wearing the brace fairly regularly when he is working out.  Xray (-)       Past Medical History:  Diagnosis Date   Factor 5 Leiden mutation, heterozygous ([place])    Umbilical hernia    No past surgical history on file. Social History   Socioeconomic History   Marital status: Married    Spouse name: Not on file   Number of children: Not on file   Years of education: Not on file   Highest education level: Not on file  Occupational History   Not on file  Tobacco Use   Smoking status: Never   Smokeless tobacco: Never  Substance and Sexual Activity   Alcohol use: Yes   Drug use: No   Sexual activity: Not on file  Other Topics Concern   Not on file  Social History Narrative   Not on file   Social Determinants of Health   Financial Resource  Strain: Not on file  Food Insecurity: Not on file  Transportation Needs: Not on file  Physical Activity: Not on file  Stress: Not on file  Social Connections: Not on file   Allergies  Allergen Reactions   Zithromax [Azithromycin] Hives   Family History  Problem Relation Age of Onset   Cancer Neg Hx     Current Outpatient Medications (Endocrine & Metabolic):    testosterone cypionate (DEPOTESTOSTERONE CYPIONATE) 200 MG/ML injection, INJECT 1.5 MLS (300 MG TOTAL) INTO THE MUSCLE EVERY 14 (FOURTEEN) DAYS.  Current Outpatient Medications (Cardiovascular):    cholestyramine (QUESTRAN) 4 g packet, MIX 1 PACKET AND TAKE BY MOUTH NIGHTLY   nitroGLYCERIN (NITRO-DUR) 0.1 mg/hr patch, 1/4 patch daily   nitroGLYCERIN (NITRO-DUR) 0.2 mg/hr patch, Apply 1/4 of a patch to skin once daily.   sildenafil (REVATIO) 20 MG tablet, Take 1-5 tablets (20-100 mg total) by mouth daily as needed (erectile dysfunction).  Current Outpatient Medications (Respiratory):    levocetirizine (XYZAL) 5 MG tablet, Take 5 mg by mouth every evening.    Current Outpatient Medications (Other):    Multiple Vitamin (MULTIVITAMIN) tablet, Take 1 tablet by mouth daily.   Reviewed prior external information including notes and imaging from  primary care provider As well as notes that were available from care everywhere and other healthcare systems.  Past medical history, social, surgical and family history all reviewed  in electronic medical record.  No pertanent information unless stated regarding to the chief complaint.   Review of Systems:  No headache, visual changes, nausea, vomiting, diarrhea, constipation, dizziness, abdominal pain, skin rash, fevers, chills, night sweats, weight loss, swollen lymph nodes, body aches, joint swelling, chest pain, shortness of breath, mood changes. POSITIVE muscle aches  Objective  Blood pressure 122/74, pulse 70, height 6' (1.829 m), weight 197 lb (89.4 kg), SpO2 96 %.    General: No apparent distress alert and oriented x3 mood and affect normal, dressed appropriately.  HEENT: Pupils equal, extraocular movements intact  Respiratory: Patient's speak in full sentences and does not appear short of breath  Cardiovascular: No lower extremity edema, non tender, no erythema  Gait normal with good balance and coordination.  MSK: Left knee exam shows the patient does have some mild lateral tracking of the patella noted.  Patient does have tenderness over the medial joint line noted.  Negative McMurray's noted.  Neurovascular intact distally.  Limited muscular skeletal ultrasound was performed and interpreted by Hulan Saas, M  Limited ultrasound shows the patient has a trace effusion of the patellofemoral joint noted.  There is mild narrowing of the patellofemoral joint.  Patient also has some degenerative changes of the meniscus noted but not severe.  No significant displacement noted. Impression: Trace effusion with medial degenerative changes of the medial meniscus with no displacement.  After informed written and verbal consent, patient was seated on exam table. Left knee was prepped with alcohol swab and utilizing anterolateral approach, patient's left knee space was injected with 4:1  marcaine 0.5%: Kenalog 40mg /dL. Patient tolerated the procedure well without immediate complications.     Impression and Recommendations:     The above documentation has been reviewed and is accurate and complete Lyndal Pulley, DO

## 2021-07-11 ENCOUNTER — Ambulatory Visit: Payer: Self-pay

## 2021-07-11 ENCOUNTER — Encounter: Payer: Self-pay | Admitting: Family Medicine

## 2021-07-11 ENCOUNTER — Ambulatory Visit: Payer: 59 | Admitting: Family Medicine

## 2021-07-11 ENCOUNTER — Other Ambulatory Visit: Payer: Self-pay

## 2021-07-11 VITALS — BP 122/74 | HR 70 | Ht 72.0 in | Wt 197.0 lb

## 2021-07-11 DIAGNOSIS — M222X2 Patellofemoral disorders, left knee: Secondary | ICD-10-CM

## 2021-07-11 MED ORDER — NITROGLYCERIN 0.1 MG/HR TD PT24: MEDICATED_PATCH | TRANSDERMAL | 3 refills | Status: AC

## 2021-07-11 NOTE — Patient Instructions (Addendum)
Injection today See you again in 6 weeks

## 2021-07-11 NOTE — Assessment & Plan Note (Signed)
October 4, 2020Patient mostly does have patellofemoral syndrome.  Ultrasound does show the patient did have a trace effusion noted of the patellofemoral joint.  In addition to this the patient also had what appeared to be may be a degenerative meniscal tear noted.  We will continue to monitor.  Worsening pain will need to consider the possibility of advanced imaging but I am optimistic patient should do relatively well.  Follow-up again in 6 weeks

## 2021-08-05 ENCOUNTER — Other Ambulatory Visit: Payer: Self-pay | Admitting: Family Medicine

## 2021-08-22 NOTE — Progress Notes (Deleted)
[place] Phone: [phone] Subjective:    I'm seeing this patient by the request  of:  Vivi Barrack, MD  CC: Left knee pain  YQI:[id number]  07/11/2021 October 4, 2020Patient mostly does have patellofemoral syndrome.  Ultrasound does show the patient did have a trace effusion noted of the patellofemoral joint.  In addition to this the patient also had what appeared to be may be a degenerative meniscal tear noted.  We will continue to monitor.  Worsening pain will need to consider the possibility of advanced imaging but I am optimistic patient should do relatively well.  Follow-up again in 6 weeks  Updated 08/23/2021 Shane [name] is a 50 y.o. male coming in with complaint of left knee pain.  Patient was seen in October and was given injection at that time.  Patient is to continue with home exercises for patellofemoral syndrome.  Patient states  Onset-  Location Duration-  Character- Aggravating factors- Reliving factors-  Therapies tried-  Severity-     Past Medical History:  Diagnosis Date   Factor 5 Leiden mutation, heterozygous ([place])    Umbilical hernia    No past surgical history on file. Social History   Socioeconomic History   Marital status: Married    Spouse name: Not on file   Number of children: Not on file   Years of education: Not on file   Highest education level: Not on file  Occupational History   Not on file  Tobacco Use   Smoking status: Never   Smokeless tobacco: Never  Substance and Sexual Activity   Alcohol use: Yes   Drug use: No   Sexual activity: Not on file  Other Topics Concern   Not on file  Social History Narrative   Not on file   Social Determinants of Health   Financial Resource Strain: Not on file  Food Insecurity: Not on file  Transportation Needs: Not on file  Physical Activity: Not on file  Stress: Not on file  Social Connections: Not on  file   Allergies  Allergen Reactions   Zithromax [Azithromycin] Hives   Family History  Problem Relation Age of Onset   Cancer Neg Hx     Current Outpatient Medications (Endocrine & Metabolic):    testosterone cypionate (DEPOTESTOSTERONE CYPIONATE) 200 MG/ML injection, INJECT 1.5 MLS (300 MG TOTAL) INTO THE MUSCLE EVERY 14 (FOURTEEN) DAYS.  Current Outpatient Medications (Cardiovascular):    cholestyramine (QUESTRAN) 4 g packet, MIX 1 PACKET AND TAKE BY MOUTH NIGHTLY   nitroGLYCERIN (NITRO-DUR) 0.1 mg/hr patch, 1/4 patch daily   nitroGLYCERIN (NITRO-DUR) 0.2 mg/hr patch, Apply 1/4 of a patch to skin once daily.   sildenafil (REVATIO) 20 MG tablet, Take 1-5 tablets (20-100 mg total) by mouth daily as needed (erectile dysfunction).  Current Outpatient Medications (Respiratory):    levocetirizine (XYZAL) 5 MG tablet, Take 5 mg by mouth every evening.    Current Outpatient Medications (Other):    Multiple Vitamin (MULTIVITAMIN) tablet, Take 1 tablet by mouth daily.   Reviewed prior external information including notes and imaging from  primary care provider As well as notes that were available from care everywhere and other healthcare systems.  Past medical history, social, surgical and family history all reviewed in electronic medical record.  No pertanent information unless stated regarding to the chief complaint.   Review of Systems:  No headache, visual changes, nausea, vomiting, diarrhea, constipation, dizziness, abdominal pain, skin  rash, fevers, chills, night sweats, weight loss, swollen lymph nodes, body aches, joint swelling, chest pain, shortness of breath, mood changes. POSITIVE muscle aches  Objective  There were no vitals taken for this visit.   General: No apparent distress alert and oriented x3 mood and affect normal, dressed appropriately.  HEENT: Pupils equal, extraocular movements intact  Respiratory: Patient's speak in full sentences and does not appear short  of breath  Cardiovascular: No lower extremity edema, non tender, no erythema  Gait normal with good balance and coordination.  MSK:  N    Impression and Recommendations:     The above documentation has been reviewed and is accurate and complete Lyndal Pulley, DO

## 2021-08-23 ENCOUNTER — Ambulatory Visit: Payer: 59 | Admitting: Family Medicine

## 2021-11-21 ENCOUNTER — Other Ambulatory Visit: Payer: Self-pay | Admitting: Family Medicine

## 2021-12-13 ENCOUNTER — Other Ambulatory Visit: Payer: Self-pay | Admitting: Family Medicine

## 2022-02-06 ENCOUNTER — Ambulatory Visit: Payer: 59 | Admitting: Family Medicine

## 2022-02-06 VITALS — BP 122/82 | HR 86 | Ht 72.0 in | Wt 202.6 lb

## 2022-02-06 DIAGNOSIS — M25522 Pain in left elbow: Secondary | ICD-10-CM | POA: Diagnosis not present

## 2022-02-06 DIAGNOSIS — G8929 Other chronic pain: Secondary | ICD-10-CM

## 2022-02-06 DIAGNOSIS — M25562 Pain in left knee: Secondary | ICD-10-CM

## 2022-02-06 NOTE — Patient Instructions (Addendum)
Thank you for coming in today.  ? ?You should hear from MRI scheduling within 1 week. If you do not hear please let me know.   ? ?Supinator muscle exercises ? ?Recheck back after MRI ?

## 2022-02-06 NOTE — Progress Notes (Signed)
? ?I, [name], LAT, ATC acting as a scribe for Shane Leader, MD. ? ?Shane [name] is a 51 y.o. male who presents to [place] at [facility] today for cont L knee pain. Pt was previously seen by Dr. Tamala Julian on 07/11/21 for L PFPS and what may be may a degenerative meniscal tear and was given a L knee steroid injection. Today, pt reports he is super active and is annoyed w/ the L knee pain and is causing him to not be able to run. Pt locates pain to the anterior medial aspect of the L knee.  He notes an instability sensation at the medial knee with running.  He denies locking but does note catching or instability. ? ?L knee swelling: no ?Mechanical symptoms: yes ?Treatments tried: compression, ice, IBU ? ?Pt also c/o L forearm and elbow pain ongoing for years. Pt c/o increased pain when lifting heavy weights. Pt locates pain to the distal triceps and over the extensor mass on the L forearm. ?Dx imaging: 05/30/21 L knee XR ? ?Pertinent review of systems: No fevers or chills ? ?Relevant historical information: Prior episodes of tendinitis elsewhere to body ? ? ? ? ?Exam:  ?BP 122/82   Pulse 86   Ht 6' (1.829 m)   Wt 202 lb 9.6 oz (91.9 kg)   SpO2 97%   BMI 27.48 kg/m?  ?General: Well Developed, well nourished, and in no acute distress.  ? ?MSK: Left knee normal. ?Normal motion. ?Mildly tender palpation medial joint line. ?Stable ligamentous exam. ?Positive medial McMurray's test. ?Intact strength ? ?Left elbow: Normal-appearing ?Nontender. ?Normal motion. ?Pain located at posterior elbow with triceps extension. ?Patient also has pain at the anterior lateral elbow with supination and flexion.  The biceps tendon itself is nontender. ?Strength is intact. ? ? ? ?Lab and Radiology Results ?EXAM: ?LEFT KNEE - COMPLETE 4+ VIEW ?  ?COMPARISON:  None. ?  ?FINDINGS: ?No acute fracture or dislocation. Small joint effusion. No evidence ?of arthropathy or other focal bone abnormality. Soft  tissues are ?unremarkable. ?  ?IMPRESSION: ?No acute osseous abnormality. ?  ?Small joint effusion. ?  ?  ?Electronically Signed ?  By: Yetta Glassman M.D. ?  On: 05/31/2021 13:31 ?  ?I, Shane [name], personally (independently) visualized and performed the interpretation of the images attached in this note. ? ? ? ? ? ?Assessment and Plan: ?51 y.o. male with  ?Left knee pain and instability thought to be due to medial meniscus tear.  He has had good trials of conservative management over the last year.  At this point plan for MRI to further characterize source of symptoms and for potential surgical planning.  Recheck after MRI. ? ?Additionally he has left elbow discomfort.  He has triceps tendinitis which we can treat these with eccentric exercises. ?He also has anterior elbow pain that is more likely to be due to brachial radialis tendinitis and supinator tendinitis.  This can be treated with eccentric exercises as well.  If not improving consider hand therapy. ? ? ?PDMP not reviewed this encounter. ?Orders Placed This Encounter  ?Procedures  ? MR KNEE LEFT WO CONTRAST  ?  Standing Status:   Future  ?  Standing Expiration Date:   03/09/2022  ?  Order Specific Question:   What is the patient's sedation requirement?  ?  Answer:   No Sedation  ?  Order Specific Question:   Does the patient have a pacemaker or implanted devices?  ?  Answer:  No  ?  Order Specific Question:   Preferred imaging location?  ?  Answer:   Product/process development scientist (table limit-350lbs)  ? ?No orders of the defined types were placed in this encounter. ? ? ? ?Discussed warning signs or symptoms. Please see discharge instructions. Patient expresses understanding. ? ? ?The above documentation has been reviewed and is accurate and complete Shane [name], M.D. ? ? ?

## 2022-02-11 ENCOUNTER — Ambulatory Visit (INDEPENDENT_AMBULATORY_CARE_PROVIDER_SITE_OTHER): Payer: 59

## 2022-02-11 DIAGNOSIS — M25562 Pain in left knee: Secondary | ICD-10-CM | POA: Diagnosis not present

## 2022-02-11 DIAGNOSIS — G8929 Other chronic pain: Secondary | ICD-10-CM | POA: Diagnosis not present

## 2022-02-12 ENCOUNTER — Other Ambulatory Visit: Payer: Self-pay | Admitting: Family Medicine

## 2022-02-13 NOTE — Progress Notes (Signed)
Left knee MRI does show a medial meniscus tear as you suspected.  You do have a small Baker's cyst as well.  This is in the back of the knee. ? ?Meniscus tear could have surgery. ? ?Recommend return to clinic to talk about the results in full detail and discuss treatment plans and options going forward.

## 2022-02-16 ENCOUNTER — Ambulatory Visit: Payer: 59 | Admitting: Family Medicine

## 2022-02-16 VITALS — BP 118/82 | HR 77 | Ht 72.0 in | Wt 197.6 lb

## 2022-02-16 DIAGNOSIS — M25562 Pain in left knee: Secondary | ICD-10-CM | POA: Diagnosis not present

## 2022-02-16 DIAGNOSIS — S83242A Other tear of medial meniscus, current injury, left knee, initial encounter: Secondary | ICD-10-CM

## 2022-02-16 NOTE — Patient Instructions (Signed)
Thank you for coming in today.  ? ?Call Ortho and get set up.  ? ?Let me know if you need anything.  ? ?OK to keep exercise.  ? ?We can get you set up with the knee brace if needed.  ? ? ?

## 2022-02-16 NOTE — Progress Notes (Signed)
? ?I, [name], LAT, ATC acting as a scribe for [name], MD. ? ?Shane [name] is a 51 y.o. male who presents to [place] at [facility] today for f/u L knee pain and MRI review. Pt was last seen by Dr. Georgina Snell on 02/06/22 and was advised to proceed to MRI to further characterize the source of symptoms. Today, pt reports L knee is feeling the same. No changes.  He does have some mechanical clicking and some feeling of instability especially with knee rotation.  He is very active and wants to make sure his knee does not get worse over time if possible. ? ?Dx imaging: 02/11/22 L knee MRI ? 05/30/21 L knee XR ? ?Pertinent review of systems: No fevers or chills ? ?Relevant historical information: Low testosterone. ? ? ?Exam:  ?BP 118/82   Pulse 77   Ht 6' (1.829 m)   Wt 197 lb 9.6 oz (89.6 kg)   SpO2 97%   BMI 26.80 kg/m?  ?General: Well Developed, well nourished, and in no acute distress.  ? ?MSK: Left knee: Mildly tender palpation medial joint line. ? ? ? ?Lab and Radiology Results ? ?EXAM: ?MRI OF THE LEFT KNEE WITHOUT CONTRAST ?  ?TECHNIQUE: ?Multiplanar, multisequence MR imaging of the knee was performed. No ?intravenous contrast was administered. ?  ?COMPARISON:  X-ray 05/30/2021 ?  ?FINDINGS: ?MENISCI ?  ?Medial meniscus: Oblique tear of the medial meniscus at the ?posterior horn-body junction extending to the superior and inferior ?articular surfaces (series 5, images 11-13). ?  ?Lateral meniscus:  Intact. ?  ?LIGAMENTS ?  ?Cruciates: Intact ACL and PCL. ?  ?Collaterals: Intact MCL. Lateral collateral ligament complex intact. ?  ?CARTILAGE ?  ?Patellofemoral:  No chondral defect. ?  ?Medial:  No chondral defect. ?  ?Lateral:  No chondral defect. ?  ?MISCELLANEOUS ?  ?Joint:  No joint effusion. Fat pads within normal limits. ?  ?Popliteal Fossa: Small leaking Baker's cyst. Intact popliteus ?tendon. ?  ?Extensor Mechanism:  Intact quadriceps and patellar tendons. ?  ?Bones: No  acute fracture. No dislocation. No bone marrow edema. No ?marrow replacing bone lesion. ?  ?Other: No significant periarticular soft tissue findings. ?  ?IMPRESSION: ?1. Medial meniscal tear. ?2. Small leaking Baker's cyst. ?  ?  ?Electronically Signed ?  By: Davina Poke D.O. ?  On: 02/12/2022 20:29 ?I, [name], personally (independently) visualized and performed the interpretation of the images attached in this note. ? ? ? ? ?Assessment and Plan: ?51 y.o. male with left knee mechanical pain due to medial meniscus tear.  The meniscus tear is posterior near the root and may be repairable.  He would like to discuss surgical options which I think is reasonable.  Orthopedic surgery referral placed today.  Recheck back with me as needed. ? ? ?PDMP not reviewed this encounter. ?Orders Placed This Encounter  ?Procedures  ? Ambulatory referral to Orthopedic Surgery  ?  Referral Priority:   Routine  ?  Referral Type:   Surgical  ?  Referral Reason:   Specialty Services Required  ?  Requested Specialty:   Orthopedic Surgery  ?  Number of Visits Requested:   1  ? ?No orders of the defined types were placed in this encounter. ? ? ? ?Discussed warning signs or symptoms. Please see discharge instructions. Patient expresses understanding. ? ? ?The above documentation has been reviewed and is accurate and complete [name], M.D. ? ? ?Total encounter time 20 minutes including  face-to-face time with the patient and, reviewing past medical record, and charting on the date of service.   ?Reviewed MRI findings and imaging and discussed treatment plan and options. ? ?

## 2022-02-22 ENCOUNTER — Encounter: Payer: Self-pay | Admitting: Orthopaedic Surgery

## 2022-02-22 ENCOUNTER — Ambulatory Visit: Payer: 59 | Admitting: Orthopaedic Surgery

## 2022-02-22 DIAGNOSIS — S83242A Other tear of medial meniscus, current injury, left knee, initial encounter: Secondary | ICD-10-CM

## 2022-02-22 NOTE — Progress Notes (Signed)
Office Visit Note   Patient: Shane [name]           Date of Birth: October 08, 1971           MRN: [id number] Visit Date: 02/22/2022              Requested by: Gregor Hams, MD [place],  [place] [address] PCP: Vivi Barrack, MD   Assessment & Plan: Visit Diagnoses:  1. Acute medial meniscus tear, left, initial encounter     Plan: Mr. Reichardt has had some recurrent issues with his left knee over a long period of time.  He is not aware of a specific injury or trauma.  He is quite active and has been noticing some discomfort along the medial aspect of his knee.  His primary care physician ordered an MRI scan because of his chronic discomfort.  The scan demonstrated an oblique tear of the medial meniscus at the posterior horn body junction extending to the superior and inferior articular surfaces.  Lateral meniscus was intact.  Cruciates and collaterals were intact.  There was no chondral defect in any of the 3 compartments.  No joint effusion.  There was a small leaking Baker's cyst.  He presently is asymptomatic and notes that the pain does come and go but it really does not interfere with his activities.  Long discussion regarding all of the above.  His exam was benign.  There is no evidence of instability.  No effusion and no pain along the medial compartment.  Based on his comfort and no compromise of activities I do not think at this point I would suggest knee arthroscopy.  He does not have any degenerative changes and I am not sure that the meniscus is that symptomatic.  I would suggest simply monitoring this over time and then have him return should it become more of a problem.  Lots of questions which have answered  Follow-Up Instructions: Return if symptoms worsen or fail to improve.   Orders:  No orders of the defined types were placed in this encounter.  No orders of the defined types were placed in this encounter.     Procedures: No procedures  performed   Clinical Data: No additional findings.   Subjective: Chief Complaint  Patient presents with   Left Knee - Pain  Patient presents today for left knee pain. He said that it started about 6 months ago. No known injury. His pain is located medially, and comes and goes. He said that the pain is worse with pivoting. He has had x-rays and an MRI done prior to today's visit. He takes over the counter medicine if needed.   HPI  Review of Systems   Objective: Vital Signs: There were no vitals taken for this visit.  Physical Exam Constitutional:      Appearance: He is well-developed.  Eyes:     Pupils: Pupils are equal, round, and reactive to light.  Pulmonary:     Effort: Pulmonary effort is normal.  Skin:    General: Skin is warm and dry.  Neurological:     Mental Status: He is alert and oriented to person, place, and time.  Psychiatric:        Behavior: Behavior normal.    Ortho Exam left knee was not hot red warm or red.  No effusion.  No medial or lateral joint pain anteriorly or posteriorly.  No patella pain or crepitation.  Negative anterior drawer sign.  Negative Lachman's test.  No opening with varus or valgus stress or pain along the MCL or LCL.  Skin intact.  No popliteal pain or mass.  No calf pain.  Painless range of motion of hips.  Walks without a limp  Specialty Comments:  No specialty comments available.  Imaging: No results found.   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 No pertinent surgical history. Social History   Occupational History   Not on file  Tobacco Use   Smoking status: Never   Smokeless tobacco: Never  Substance and Sexual Activity   Alcohol use: Yes   Drug use: No   Sexual activity: Not on file

## 2022-02-23 ENCOUNTER — Ambulatory Visit: Payer: 59 | Admitting: Orthopaedic Surgery

## 2022-03-23 ENCOUNTER — Other Ambulatory Visit: Payer: Self-pay | Admitting: Family Medicine

## 2022-03-25 ENCOUNTER — Telehealth: Payer: Self-pay | Admitting: Family Medicine

## 2022-03-26 NOTE — Telephone Encounter (Signed)
LAST APPOINTMENT DATE: 02/24/2021   NEXT APPOINTMENT DATE: Visit date not found    LAST REFILL: 02/12/2022  QTY:51m

## 2022-03-26 NOTE — Telephone Encounter (Signed)
Left message to return call to our office at their convenience to schedule appointment with PCP for refills

## 2022-03-26 NOTE — Telephone Encounter (Signed)
Please call patient to schedule an upcoming appointment with PCP. I will send this in after patient schedules appointment.

## 2022-03-27 MED ORDER — TESTOSTERONE CYPIONATE 200 MG/ML IM SOLN: 300.0000 mg | INTRAMUSCULAR | 0 refills | Status: DC

## 2022-03-27 NOTE — Telephone Encounter (Signed)
Appointment scheduled.

## 2022-03-27 NOTE — Telephone Encounter (Signed)
Patient called and schedule appointment with PCP on 04/20/22. Patient requests a RX for testosterone cypionate (DEPOTESTOSTERONE CYPIONATE) 200 MG/ML injection [id number]  [phone] Fax:  [phone]   Patient requests enough of the above medication to last until his appointment on 04/20/22.

## 2022-04-20 ENCOUNTER — Ambulatory Visit: Payer: 59 | Admitting: Family Medicine

## 2022-04-20 ENCOUNTER — Encounter: Payer: Self-pay | Admitting: Family Medicine

## 2022-04-20 VITALS — BP 120/72 | HR 92 | Temp 98.4°F | Ht 72.0 in | Wt 193.0 lb

## 2022-04-20 DIAGNOSIS — N529 Male erectile dysfunction, unspecified: Secondary | ICD-10-CM | POA: Diagnosis not present

## 2022-04-20 DIAGNOSIS — R7989 Other specified abnormal findings of blood chemistry: Secondary | ICD-10-CM | POA: Diagnosis not present

## 2022-04-20 LAB — CBC
HCT: 50.9 % (ref 39.0–52.0)
Hemoglobin: 17.1 g/dL — ABNORMAL HIGH (ref 13.0–17.0)
MCHC: 33.6 g/dL (ref 30.0–36.0)
MCV: 94.6 fl (ref 78.0–100.0)
Platelets: 306 10*3/uL (ref 150.0–400.0)
RBC: 5.38 Mil/uL (ref 4.22–5.81)
RDW: 13 % (ref 11.5–15.5)
WBC: 8.6 10*3/uL (ref 4.0–10.5)

## 2022-04-20 LAB — COMPREHENSIVE METABOLIC PANEL
ALT: 30 U/L (ref 0–53)
AST: 37 U/L (ref 0–37)
Albumin: 4.6 g/dL (ref 3.5–5.2)
Alkaline Phosphatase: 45 U/L (ref 39–117)
BUN: 33 mg/dL — ABNORMAL HIGH (ref 6–23)
CO2: 24 mEq/L (ref 19–32)
Calcium: 9.6 mg/dL (ref 8.4–10.5)
Chloride: 99 mEq/L (ref 96–112)
Creatinine, Ser: 1.34 mg/dL (ref 0.40–1.50)
GFR: 60.87 mL/min (ref >60.00)
Glucose, Bld: 82 mg/dL (ref 70–99)
Potassium: 4.2 mEq/L (ref 3.5–5.1)
Sodium: 135 mEq/L (ref 135–145)
Total Bilirubin: 0.9 mg/dL (ref 0.2–1.2)
Total Protein: 7.4 g/dL (ref 6.0–8.3)

## 2022-04-20 LAB — TESTOSTERONE: Testosterone: 85.63 ng/dL — ABNORMAL LOW (ref 300.00–890.00)

## 2022-04-20 MED ORDER — TESTOSTERONE CYPIONATE 200 MG/ML IM SOLN: 300.0000 mg | INTRAMUSCULAR | 0 refills | Status: DC

## 2022-04-20 NOTE — Assessment & Plan Note (Signed)
He is on testosterone placement 300 mg IM every 14 days.  Check labs today.  He is interested in possibly switching to alternative formulation.  I believe this will be beneficial due to more consistent dosing.  He will check with insurance to see if they have a.

## 2022-04-20 NOTE — Assessment & Plan Note (Signed)
Doing well on sildenafil as needed.  Does not need refill today.

## 2022-04-20 NOTE — Patient Instructions (Signed)
It was very nice to see you today!  We we will check blood work today.  I will refill your testosterone.  You can check with your insurance to see if they will pay for the patches or phone.  Please come in to see me in about a year or so.  Come back sooner if needed.  Take care, Dr Jerline Pain  PLEASE NOTE:  If you had any lab tests please let us know if you have not heard back within a few days. You may see your results on mychart before we have a chance to review them but we will give you a call once they are reviewed by [place]. If we ordered any referrals today, please let us know if you have not heard from their office within the next week.   Please try these tips to maintain a healthy lifestyle:  Eat at least 3 REAL meals and 1-2 snacks per day.  Aim for no more than 5 hours between eating.  If you eat breakfast, please do so within one hour of getting up.   Each meal should contain half fruits/vegetables, one quarter protein, and one quarter carbs (no bigger than a computer mouse)  Cut down on sweet beverages. This includes juice, soda, and sweet tea.   Drink at least 1 glass of water with each meal and aim for at least 8 glasses per day  Exercise at least 150 minutes every week.

## 2022-04-20 NOTE — Progress Notes (Signed)
   Shane [name] is a 52 y.o. male who presents today for an office visit.  Assessment/Plan:  Chronic Problems Addressed Today: Low testosterone He is on testosterone placement 300 mg IM every 14 days.  Check labs today.  He is interested in possibly switching to alternative formulation.  I believe this will be beneficial due to more consistent dosing.  He will check with insurance to see if they have a.  Erectile dysfunction Doing well on sildenafil as needed.  Does not need refill today.     Subjective:  HPI:  See A/p for status of chronic conditions.         Objective:  Physical Exam: BP 120/72   Pulse 92   Temp 98.4 F (36.9 C)   Ht 6' (1.829 m)   Wt 193 lb (87.5 kg)   SpO2 96%   BMI 26.18 kg/m   Gen: No acute distress, resting comfortably CV: Regular rate and rhythm with no murmurs appreciated Pulm: Normal work of breathing, clear to auscultation bilaterally with no crackles, wheezes, or rhonchi Neuro: Grossly normal, moves all extremities Psych: Normal affect and thought content      Shane Rager M. Jerline Pain, MD 04/20/2022 1:32 PM

## 2022-04-23 NOTE — Progress Notes (Signed)
Please inform patient of the following:  Testosterone level wager he was not in therapeutic range.  Can we have him come back for a midcycle draw if possible.  Everything else is NORMAL.   Algis Greenhouse. Jerline Pain, MD 04/23/2022 3:44 PM

## 2022-05-22 ENCOUNTER — Other Ambulatory Visit: Payer: Self-pay | Admitting: Family Medicine

## 2022-06-11 ENCOUNTER — Other Ambulatory Visit: Payer: Self-pay | Admitting: Family Medicine

## 2022-07-02 ENCOUNTER — Encounter: Payer: Self-pay | Admitting: *Deleted

## 2022-07-07 ENCOUNTER — Other Ambulatory Visit: Payer: Self-pay | Admitting: Family Medicine

## 2022-09-20 ENCOUNTER — Encounter: Payer: Self-pay | Admitting: *Deleted

## 2022-10-09 ENCOUNTER — Other Ambulatory Visit: Payer: Self-pay | Admitting: Sports Medicine

## 2022-10-09 ENCOUNTER — Ambulatory Visit
Admission: RE | Admit: 2022-10-09 | Discharge: 2022-10-09 | Disposition: A | Discharge status: Home / Self Care | Payer: 59 | Source: Ambulatory Visit | Attending: Sports Medicine | Admitting: Sports Medicine

## 2022-10-09 DIAGNOSIS — M25561 Pain in right knee: Secondary | ICD-10-CM

## 2022-10-12 ENCOUNTER — Other Ambulatory Visit: Payer: Self-pay | Admitting: Sports Medicine

## 2022-10-12 DIAGNOSIS — M25561 Pain in right knee: Secondary | ICD-10-CM

## 2022-10-26 ENCOUNTER — Ambulatory Visit
Admission: RE | Admit: 2022-10-26 | Discharge: 2022-10-26 | Disposition: A | Discharge status: Home / Self Care | Payer: 59 | Source: Ambulatory Visit | Attending: Sports Medicine | Admitting: Sports Medicine

## 2022-10-26 DIAGNOSIS — M25561 Pain in right knee: Secondary | ICD-10-CM

## 2022-11-26 ENCOUNTER — Other Ambulatory Visit: Payer: Self-pay | Admitting: Family Medicine

## 2023-03-21 ENCOUNTER — Telehealth: Payer: Self-pay | Admitting: *Deleted

## 2023-03-21 NOTE — Telephone Encounter (Signed)
(  [employer] [facility]) - [id number] Sildenafil Citrate 20MG  tablets Sent: June 13th, 2024 Waiting for determination

## 2023-03-23 NOTE — Telephone Encounter (Signed)
Patient Advocate Encounter  Received a fax from [employer] regarding Prior Authorization for Sildenafil Citrate 20MG  tablets.   Key: [id number]  Authorization has been DENIED due to    Determination letter attached to patient chart

## 2023-03-26 ENCOUNTER — Other Ambulatory Visit: Payer: Self-pay | Admitting: Family Medicine

## 2023-03-26 MED ORDER — TESTOSTERONE CYPIONATE 200 MG/ML IM SOLN: 300.0000 mg | INTRAMUSCULAR | 5 refills | Status: AC

## 2023-03-26 NOTE — Telephone Encounter (Signed)
PT IS MOVING TO [place] AND NEEDS JUST ENOUGH UNTIL HE FIND A NEW PROVIDER.    Prescription Request  03/26/2023  LOV: 04/20/2022  What is the name of the medication or equipment?  testosterone cypionate (DEPOTESTOSTERONE CYPIONATE) 200 MG/ML injection   Have you contacted your pharmacy to request a refill? Yes   Which pharmacy would you like this sent to?  [id number] [place] [place] - [address] [place] [address] Phone: [phone] Fax: [phone]    Patient notified that their request is being sent to the clinical staff for review and that they should receive a response within 2 business days.   Please advise at Mobile [phone] (mobile)

## 2023-03-27 ENCOUNTER — Telehealth: Payer: Self-pay

## 2023-03-27 NOTE — Telephone Encounter (Signed)
*  Primary  PA request received for Testosterone Cypionate 200MG /ML intramuscular solution  PA submitted to [employer] and is pending additional questions/determination  Key: [id number]

## 2023-03-28 NOTE — Telephone Encounter (Signed)
Rx testosterone denied Sildenafil denied  Patient notified stated has new insurance  Advise to change information in his pharmacy  Verbalized understanding

## 2023-03-28 NOTE — Telephone Encounter (Signed)
Left message to return call to our office at their convenience.  

## 2023-03-28 NOTE — Telephone Encounter (Signed)
.  smg 

## 2023-03-29 NOTE — Telephone Encounter (Signed)
PA has been DENIED:   Your request was denied because we did not see what we need to approve the drug you asked for, (testosterone cypionate). We may be able to approve this drug in a certain situation (if you have had serum testosterone levels measured in the previous 180 days). We do not see that this applies to you. If this applies to you, we may need more information (if your symptoms have improved). We based this decision on your health plan's prior authorization clinical criteria named Testosterone Injectable.

## 2023-04-01 NOTE — Telephone Encounter (Signed)
Please let patient know insurance has denied his testosterone.  Katina Degree. [name], MD 04/01/2023 7:30 AM

## 2023-04-02 ENCOUNTER — Other Ambulatory Visit: Payer: Self-pay | Admitting: *Deleted

## 2023-04-02 MED ORDER — SILDENAFIL CITRATE 20 MG PO TABS: 20.0000 mg | ORAL_TABLET | Freq: Every day | ORAL | 3 refills | Status: AC | PRN

## 2023-04-02 NOTE — Telephone Encounter (Signed)
Patient aware, stated used discount card

## 2023-04-09 ENCOUNTER — Other Ambulatory Visit (HOSPITAL_COMMUNITY): Payer: Self-pay

## 2023-05-30 ENCOUNTER — Other Ambulatory Visit (HOSPITAL_COMMUNITY): Payer: Self-pay

## 2023-05-30 ENCOUNTER — Telehealth: Payer: Self-pay | Admitting: Pharmacy Technician

## 2023-05-30 NOTE — Telephone Encounter (Signed)
Pharmacy Patient Advocate Encounter   Received notification from CoverMyMeds that prior authorization for Sildenafil Citrate 20MG  tablets is required/requested.   Insurance verification completed.   The patient is insured through  [employer]  .   Per test claim: PA required; PA submitted to [employer] via CoverMyMeds Key/confirmation #/EOC [id number] Status is pending

## 2023-05-30 NOTE — Telephone Encounter (Signed)
Pharmacy Patient Advocate Encounter  Received notification from  [employer]  that Prior Authorization for Sildenafil Citrate 20MG  tablets has been DENIED. Please advise how you'd like to proceed. Full denial letter will be uploaded to the media tab. See denial reason below.   PA #/Case ID/Reference #: [id number]

## 2023-05-31 NOTE — Telephone Encounter (Signed)
Noted. Please let patient know.  Shane [name]. [name], MD 05/31/2023 10:12 AM

## 2023-06-05 ENCOUNTER — Other Ambulatory Visit: Payer: Self-pay | Admitting: Family Medicine
